# Patient Record
Sex: Male | Born: 1950 | Race: White | Hispanic: No | Marital: Married | Smoking: Never smoker
Health system: Southern US, Community
[De-identification: ages and names within clinical notes are randomized; demographics above are authoritative.]

## PROBLEM LIST (undated history)

## (undated) ENCOUNTER — Emergency Department (HOSPITAL_COMMUNITY): Payer: PPO

## (undated) DIAGNOSIS — I10 Essential (primary) hypertension: Secondary | ICD-10-CM

## (undated) DIAGNOSIS — I2510 Atherosclerotic heart disease of native coronary artery without angina pectoris: Secondary | ICD-10-CM

## (undated) DIAGNOSIS — K219 Gastro-esophageal reflux disease without esophagitis: Secondary | ICD-10-CM

## (undated) DIAGNOSIS — F329 Major depressive disorder, single episode, unspecified: Secondary | ICD-10-CM

## (undated) DIAGNOSIS — F32A Depression, unspecified: Secondary | ICD-10-CM

## (undated) DIAGNOSIS — E785 Hyperlipidemia, unspecified: Secondary | ICD-10-CM

## (undated) HISTORY — PX: CORONARY ARTERY BYPASS GRAFT: SHX141

## (undated) HISTORY — PX: HERNIA REPAIR: SHX51

## (undated) HISTORY — PX: CARDIAC SURGERY: SHX584

---
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
## 2017-05-25 DIAGNOSIS — E538 Deficiency of other specified B group vitamins: Secondary | ICD-10-CM | POA: Diagnosis not present

## 2017-05-25 DIAGNOSIS — E782 Mixed hyperlipidemia: Secondary | ICD-10-CM | POA: Diagnosis not present

## 2017-05-25 DIAGNOSIS — R5383 Other fatigue: Secondary | ICD-10-CM | POA: Diagnosis not present

## 2017-05-25 DIAGNOSIS — I10 Essential (primary) hypertension: Secondary | ICD-10-CM | POA: Diagnosis not present

## 2017-05-25 DIAGNOSIS — I739 Peripheral vascular disease, unspecified: Secondary | ICD-10-CM | POA: Diagnosis not present

## 2017-05-25 DIAGNOSIS — I252 Old myocardial infarction: Secondary | ICD-10-CM | POA: Diagnosis not present

## 2017-05-25 DIAGNOSIS — R5381 Other malaise: Secondary | ICD-10-CM | POA: Diagnosis not present

## 2017-05-25 DIAGNOSIS — I25810 Atherosclerosis of coronary artery bypass graft(s) without angina pectoris: Secondary | ICD-10-CM | POA: Diagnosis not present

## 2017-06-04 DIAGNOSIS — I8002 Phlebitis and thrombophlebitis of superficial vessels of left lower extremity: Secondary | ICD-10-CM | POA: Diagnosis not present

## 2017-06-12 DIAGNOSIS — I25810 Atherosclerosis of coronary artery bypass graft(s) without angina pectoris: Secondary | ICD-10-CM | POA: Diagnosis not present

## 2017-06-12 DIAGNOSIS — I10 Essential (primary) hypertension: Secondary | ICD-10-CM | POA: Diagnosis not present

## 2017-06-12 DIAGNOSIS — I252 Old myocardial infarction: Secondary | ICD-10-CM | POA: Diagnosis not present

## 2017-06-12 DIAGNOSIS — E782 Mixed hyperlipidemia: Secondary | ICD-10-CM | POA: Diagnosis not present

## 2017-07-17 DIAGNOSIS — I803 Phlebitis and thrombophlebitis of lower extremities, unspecified: Secondary | ICD-10-CM | POA: Diagnosis not present

## 2017-07-17 DIAGNOSIS — I8311 Varicose veins of right lower extremity with inflammation: Secondary | ICD-10-CM | POA: Diagnosis not present

## 2017-07-17 DIAGNOSIS — I8312 Varicose veins of left lower extremity with inflammation: Secondary | ICD-10-CM | POA: Diagnosis not present

## 2017-08-26 DIAGNOSIS — I25810 Atherosclerosis of coronary artery bypass graft(s) without angina pectoris: Secondary | ICD-10-CM | POA: Diagnosis not present

## 2017-08-26 DIAGNOSIS — I739 Peripheral vascular disease, unspecified: Secondary | ICD-10-CM | POA: Diagnosis not present

## 2017-08-26 DIAGNOSIS — E782 Mixed hyperlipidemia: Secondary | ICD-10-CM | POA: Diagnosis not present

## 2017-08-26 DIAGNOSIS — R5383 Other fatigue: Secondary | ICD-10-CM | POA: Diagnosis not present

## 2017-08-26 DIAGNOSIS — R5381 Other malaise: Secondary | ICD-10-CM | POA: Diagnosis not present

## 2017-08-26 DIAGNOSIS — E538 Deficiency of other specified B group vitamins: Secondary | ICD-10-CM | POA: Diagnosis not present

## 2017-08-26 DIAGNOSIS — M150 Primary generalized (osteo)arthritis: Secondary | ICD-10-CM | POA: Diagnosis not present

## 2017-08-26 DIAGNOSIS — I10 Essential (primary) hypertension: Secondary | ICD-10-CM | POA: Diagnosis not present

## 2017-08-31 DIAGNOSIS — L03113 Cellulitis of right upper limb: Secondary | ICD-10-CM | POA: Diagnosis not present

## 2017-08-31 DIAGNOSIS — S61011A Laceration without foreign body of right thumb without damage to nail, initial encounter: Secondary | ICD-10-CM | POA: Diagnosis not present

## 2017-08-31 DIAGNOSIS — R413 Other amnesia: Secondary | ICD-10-CM | POA: Diagnosis not present

## 2017-08-31 DIAGNOSIS — I739 Peripheral vascular disease, unspecified: Secondary | ICD-10-CM | POA: Diagnosis not present

## 2017-08-31 DIAGNOSIS — Z23 Encounter for immunization: Secondary | ICD-10-CM | POA: Diagnosis not present

## 2017-09-02 ENCOUNTER — Inpatient Hospital Stay (HOSPITAL_COMMUNITY)
Admission: EM | Admit: 2017-09-02 | Discharge: 2017-09-04 | DRG: 603 | Disposition: A | Discharge status: Home / Self Care | Payer: PPO | Attending: Internal Medicine | Admitting: Internal Medicine

## 2017-09-02 ENCOUNTER — Other Ambulatory Visit: Payer: Self-pay

## 2017-09-02 ENCOUNTER — Encounter (HOSPITAL_COMMUNITY): Payer: Self-pay | Admitting: Emergency Medicine

## 2017-09-02 ENCOUNTER — Emergency Department (HOSPITAL_COMMUNITY): Payer: PPO

## 2017-09-02 DIAGNOSIS — S60511A Abrasion of right hand, initial encounter: Secondary | ICD-10-CM | POA: Diagnosis present

## 2017-09-02 DIAGNOSIS — I2510 Atherosclerotic heart disease of native coronary artery without angina pectoris: Secondary | ICD-10-CM | POA: Diagnosis present

## 2017-09-02 DIAGNOSIS — I10 Essential (primary) hypertension: Secondary | ICD-10-CM | POA: Diagnosis not present

## 2017-09-02 DIAGNOSIS — W5501XA Bitten by cat, initial encounter: Secondary | ICD-10-CM | POA: Diagnosis not present

## 2017-09-02 DIAGNOSIS — L0390 Cellulitis, unspecified: Secondary | ICD-10-CM | POA: Diagnosis present

## 2017-09-02 DIAGNOSIS — L03113 Cellulitis of right upper limb: Secondary | ICD-10-CM | POA: Diagnosis not present

## 2017-09-02 DIAGNOSIS — Z7982 Long term (current) use of aspirin: Secondary | ICD-10-CM | POA: Diagnosis not present

## 2017-09-02 DIAGNOSIS — L089 Local infection of the skin and subcutaneous tissue, unspecified: Secondary | ICD-10-CM | POA: Diagnosis not present

## 2017-09-02 DIAGNOSIS — Z87891 Personal history of nicotine dependence: Secondary | ICD-10-CM

## 2017-09-02 DIAGNOSIS — Z79899 Other long term (current) drug therapy: Secondary | ICD-10-CM | POA: Diagnosis not present

## 2017-09-02 DIAGNOSIS — S61559A Open bite of unspecified wrist, initial encounter: Secondary | ICD-10-CM | POA: Diagnosis not present

## 2017-09-02 DIAGNOSIS — I739 Peripheral vascular disease, unspecified: Secondary | ICD-10-CM | POA: Diagnosis not present

## 2017-09-02 DIAGNOSIS — Z951 Presence of aortocoronary bypass graft: Secondary | ICD-10-CM | POA: Diagnosis not present

## 2017-09-02 DIAGNOSIS — W5503XA Scratched by cat, initial encounter: Secondary | ICD-10-CM | POA: Diagnosis not present

## 2017-09-02 DIAGNOSIS — R001 Bradycardia, unspecified: Secondary | ICD-10-CM | POA: Diagnosis not present

## 2017-09-02 DIAGNOSIS — S61451A Open bite of right hand, initial encounter: Secondary | ICD-10-CM | POA: Diagnosis not present

## 2017-09-02 HISTORY — DX: Essential (primary) hypertension: I10

## 2017-09-02 HISTORY — DX: Atherosclerotic heart disease of native coronary artery without angina pectoris: I25.10

## 2017-09-02 LAB — CBC WITH DIFFERENTIAL/PLATELET
ABS IMMATURE GRANULOCYTES: 0.2 10*3/uL — AB (ref 0.0–0.1)
BASOS PCT: 1 %
Basophils Absolute: 0.1 10*3/uL (ref 0.0–0.1)
Eosinophils Absolute: 0.2 10*3/uL (ref 0.0–0.7)
Eosinophils Relative: 2 %
HCT: 47 % (ref 39.0–52.0)
HEMOGLOBIN: 14.9 g/dL (ref 13.0–17.0)
IMMATURE GRANULOCYTES: 2 %
Lymphocytes Relative: 18 %
Lymphs Abs: 1.7 10*3/uL (ref 0.7–4.0)
MCH: 29.6 pg (ref 26.0–34.0)
MCHC: 31.7 g/dL (ref 30.0–36.0)
MCV: 93.4 fL (ref 78.0–100.0)
MONO ABS: 1.1 10*3/uL — AB (ref 0.1–1.0)
MONOS PCT: 12 %
NEUTROS ABS: 5.9 10*3/uL (ref 1.7–7.7)
NEUTROS PCT: 65 %
PLATELETS: 237 10*3/uL (ref 150–400)
RBC: 5.03 MIL/uL (ref 4.22–5.81)
RDW: 13 % (ref 11.5–15.5)
WBC: 9 10*3/uL (ref 4.0–10.5)

## 2017-09-02 LAB — BASIC METABOLIC PANEL
ANION GAP: 9 (ref 5–15)
BUN: 21 mg/dL (ref 8–23)
CO2: 26 mmol/L (ref 22–32)
Calcium: 9 mg/dL (ref 8.9–10.3)
Chloride: 104 mmol/L (ref 98–111)
Creatinine, Ser: 0.93 mg/dL (ref 0.61–1.24)
GFR calc Af Amer: >60 mL/min (ref >60)
GLUCOSE: 94 mg/dL (ref 70–99)
POTASSIUM: 4.5 mmol/L (ref 3.5–5.1)
Sodium: 139 mmol/L (ref 135–145)

## 2017-09-02 MED ORDER — AMLODIPINE BESYLATE 2.5 MG PO TABS
2.5000 mg | ORAL_TABLET | Freq: Every day | ORAL | Status: DC
  Administered 2017-09-03 – 2017-09-04 (×2): 2.5 mg via ORAL
  Filled 2017-09-02 (×2): qty 1

## 2017-09-02 MED ORDER — GABAPENTIN 300 MG PO CAPS
300.0000 mg | ORAL_CAPSULE | Freq: Every day | ORAL | Status: DC
Start: 2017-09-02 — End: 2017-09-04
  Administered 2017-09-02: 300 mg via ORAL
  Administered 2017-09-03: 600 mg via ORAL
  Filled 2017-09-02: qty 1
  Filled 2017-09-02: qty 2

## 2017-09-02 MED ORDER — SODIUM CHLORIDE 0.9 % IV SOLN
3.0000 g | Freq: Four times a day (QID) | INTRAVENOUS | Status: DC
  Administered 2017-09-02 – 2017-09-04 (×7): 3 g via INTRAVENOUS
  Filled 2017-09-02 (×9): qty 3

## 2017-09-02 MED ORDER — ACETAMINOPHEN 650 MG RE SUPP: 650.0000 mg | Freq: Four times a day (QID) | RECTAL | Status: DC | PRN

## 2017-09-02 MED ORDER — ONDANSETRON HCL 4 MG PO TABS: 4.0000 mg | ORAL_TABLET | Freq: Four times a day (QID) | ORAL | Status: DC | PRN

## 2017-09-02 MED ORDER — ASPIRIN 81 MG PO CHEW
81.0000 mg | CHEWABLE_TABLET | Freq: Every day | ORAL | Status: DC
  Administered 2017-09-04: 81 mg via ORAL
  Filled 2017-09-02: qty 1

## 2017-09-02 MED ORDER — ESCITALOPRAM OXALATE 20 MG PO TABS
20.0000 mg | ORAL_TABLET | Freq: Every day | ORAL | Status: DC
  Administered 2017-09-03 – 2017-09-04 (×2): 20 mg via ORAL
  Filled 2017-09-02 (×2): qty 1

## 2017-09-02 MED ORDER — LOSARTAN POTASSIUM 50 MG PO TABS
100.0000 mg | ORAL_TABLET | Freq: Every day | ORAL | Status: DC
  Administered 2017-09-03 – 2017-09-04 (×2): 100 mg via ORAL
  Filled 2017-09-02 (×2): qty 2

## 2017-09-02 MED ORDER — ACETAMINOPHEN 325 MG PO TABS
650.0000 mg | ORAL_TABLET | Freq: Four times a day (QID) | ORAL | Status: DC | PRN
  Administered 2017-09-03 – 2017-09-04 (×3): 650 mg via ORAL
  Filled 2017-09-02 (×3): qty 2

## 2017-09-02 MED ORDER — SENNOSIDES-DOCUSATE SODIUM 8.6-50 MG PO TABS: 1.0000 | ORAL_TABLET | Freq: Every evening | ORAL | Status: DC | PRN

## 2017-09-02 MED ORDER — FENTANYL CITRATE (PF) 100 MCG/2ML IJ SOLN
25.0000 ug | Freq: Once | INTRAMUSCULAR | Status: AC
  Administered 2017-09-02: 25 ug via INTRAVENOUS
  Filled 2017-09-02: qty 2

## 2017-09-02 MED ORDER — PANTOPRAZOLE SODIUM 40 MG PO TBEC
40.0000 mg | DELAYED_RELEASE_TABLET | Freq: Every day | ORAL | Status: DC
  Administered 2017-09-03 – 2017-09-04 (×2): 40 mg via ORAL
  Filled 2017-09-02 (×2): qty 1

## 2017-09-02 MED ORDER — ROSUVASTATIN CALCIUM 40 MG PO TABS
40.0000 mg | ORAL_TABLET | Freq: Every day | ORAL | Status: DC
  Administered 2017-09-02 – 2017-09-03 (×2): 40 mg via ORAL
  Filled 2017-09-02 (×4): qty 1

## 2017-09-02 MED ORDER — ENOXAPARIN SODIUM 40 MG/0.4ML ~~LOC~~ SOLN
40.0000 mg | SUBCUTANEOUS | Status: DC
  Administered 2017-09-02 – 2017-09-03 (×2): 40 mg via SUBCUTANEOUS
  Filled 2017-09-02 (×2): qty 0.4

## 2017-09-02 MED ORDER — SODIUM CHLORIDE 0.9 % IV SOLN
3.0000 g | Freq: Once | INTRAVENOUS | Status: AC
  Administered 2017-09-02: 3 g via INTRAVENOUS
  Filled 2017-09-02: qty 3

## 2017-09-02 MED ORDER — HYDROCODONE-ACETAMINOPHEN 5-325 MG PO TABS
1.0000 | ORAL_TABLET | ORAL | Status: DC | PRN
  Administered 2017-09-02 – 2017-09-03 (×3): 2 via ORAL
  Filled 2017-09-02 (×3): qty 2

## 2017-09-02 MED ORDER — METOPROLOL TARTRATE 25 MG PO TABS
25.0000 mg | ORAL_TABLET | Freq: Two times a day (BID) | ORAL | Status: DC
  Administered 2017-09-02 – 2017-09-04 (×4): 25 mg via ORAL
  Filled 2017-09-02 (×4): qty 1

## 2017-09-02 MED ORDER — ONDANSETRON HCL 4 MG/2ML IJ SOLN: 4.0000 mg | Freq: Four times a day (QID) | INTRAMUSCULAR | Status: DC | PRN

## 2017-09-02 NOTE — ED Notes (Signed)
Attempted to call report x 1  

## 2017-09-02 NOTE — Consult Note (Signed)
Reason for Consult:right hand and wrist cat bite Referring Physician: [name] is an 67 y.o. male.  HPI: patient's very pleasant 67 year old right-hand-dominant male status post right hand and wrist cat bite or scratch that occurred on Sunday this past week has been treated with an initial dose of IV Rocephin and then started on by mouth Augmentin yesterday. Patient presents today with worsening swelling and pain in his right hand and wrist. He denies any fever, chills, or drainage.  Past Medical History:  Diagnosis Date  . Coronary artery disease   . Hypertension     Past Surgical History:  Procedure Laterality Date  . CARDIAC SURGERY    . HERNIA REPAIR      History reviewed. No pertinent family history.  Social History:  reports that he has never smoked. He has never used smokeless tobacco. He reports that he drank alcohol. He reports that he does not use drugs.  Allergies: No Known Allergies  Medications: Prior to Admission:  (Not in a hospital admission)  Results for orders placed or performed during the hospital encounter of 09/02/17 (from the past 48 hour(s))  CBC with Differential     Status: Abnormal   Collection Time: 09/02/17  1:07 PM  Result Value Ref Range   WBC 9.0 4.0 - 10.5 K/uL   RBC 5.03 4.22 - 5.81 MIL/uL   Hemoglobin 14.9 13.0 - 17.0 g/dL   HCT 47.0 39.0 - 52.0 %   MCV 93.4 78.0 - 100.0 fL   MCH 29.6 26.0 - 34.0 pg   MCHC 31.7 30.0 - 36.0 g/dL   RDW 13.0 11.5 - 15.5 %   Platelets 237 150 - 400 K/uL   Neutrophils Relative % 65 %   Neutro Abs 5.9 1.7 - 7.7 K/uL   Lymphocytes Relative 18 %   Lymphs Abs 1.7 0.7 - 4.0 K/uL   Monocytes Relative 12 %   Monocytes Absolute 1.1 (H) 0.1 - 1.0 K/uL   Eosinophils Relative 2 %   Eosinophils Absolute 0.2 0.0 - 0.7 K/uL   Basophils Relative 1 %   Basophils Absolute 0.1 0.0 - 0.1 K/uL   Immature Granulocytes 2 %   Abs Immature Granulocytes 0.2 (H) 0.0 - 0.1 K/uL    Comment: Performed at [facility], [address], [place], [place] [address]    No results found.  Review of Systems  All other systems reviewed and are negative.  Blood pressure 127/71, pulse (!) 56, temperature 98.7 F (37.1 C), temperature source Oral, resp. rate 11, SpO2 96 %. Physical Exam  Constitutional: He is oriented to person, place, and time. He appears well-developed and well-nourished.  HENT:  Head: Normocephalic and atraumatic.  Neck: Normal range of motion.  Cardiovascular: Normal rate.  Respiratory: Effort normal.  Musculoskeletal:       Right wrist: He exhibits tenderness, swelling and effusion.  Right hand global dorsal swelling without discernible abscess and cellulitis. No evidence of joint or flexor sheath involvement.  Neurological: He is alert and oriented to person, place, and time.  Skin: Skin is warm. There is erythema.  Psychiatric: He has a normal mood and affect. His behavior is normal. Judgment and thought content normal.    Assessment/Plan: 67 year old male with history of cat bite or scratch to right hand and wrist approximately 3 days ago with what appears to be cellulitis involving the dorsal aspect of his right hand and wrist with no discernible abscess noted. At this point time  I recommend admission for intravenous Unasyn and elevation of extremity with reassessment in 24-48 hours. Have discussed with the patient who agrees with plan.  Carlos Rowland [facility] 09/02/2017, 1:41 PM

## 2017-09-02 NOTE — Progress Notes (Signed)
Spoke to covering MD to order sling arm elevator to keep Pt's arm elevated to help with swelling and pain management, approved.  Phoned ortho tech to have them come and place the Pt in a sling.

## 2017-09-02 NOTE — H&P (Signed)
Date: 09/02/2017               Patient Name:  Carlos Rowland MRN: [id number]  DOB: 07-Jun-1950 Age / Sex: 67 y.o., male   PCP: Raina Mina., MD         Medical Service: Internal Medicine Teaching Service         Attending Physician: Dr. Aldine Contes, MD    First Contact: Dr. Frederico Hamman  Pager: [phone]  Second Contact: Dr. Danford Bad  Pager: [phone]       After Hours (After 5p/  First Contact Pager: [phone]  weekends / holidays): Second Contact Pager: [phone]   Chief Complaint: Pain and swelling of R hand   History of Present Illness:  Carlos Rowland is a 67 yo M with history of CAD s/p CABG, PVD, and HTN who presents to the ED with worsening erythema, pain, and swelling of R hand and arm after a cat bite/scrath 3 days ago. He was seen by his PCP 2 days ago and received Rocephin x1 and started on Augmentin. He has been compliant with the antibiotics but his pain and swelling have continued to worsen. He has not been febrile and denies systemic symptoms of infection. Denies HA, changes in vision, CP, SOB, cough, abdominal pain, N/V, urinary symptoms, and changes in bowel movements.   ED course: Patient afebrile and HDS on arrival. Blood work unremarkable. R arm and hand imagine unremarkable as well. He was evaluated by ortho who recommended IV antibiotics and re-evaluation tomorrow. He received Fentanyl x1.   Meds:  Current Meds  Medication Sig  . amLODipine (NORVASC) 2.5 MG tablet Take 2.5 mg by mouth daily.  [name] Kitchen amoxicillin-clavulanate (AUGMENTIN) 875-125 MG tablet Take 1 tablet by mouth 2 (two) times daily. Duration 10 days  . aspirin 81 MG chewable tablet Chew 81 mg by mouth daily.  [name] Kitchen escitalopram (LEXAPRO) 20 MG tablet Take 20 mg by mouth daily.  [name] Kitchen gabapentin (NEURONTIN) 300 MG capsule Take 300-600 mg by mouth at bedtime.  [name] Kitchen losartan (COZAAR) 100 MG tablet Take 100 mg by mouth daily.  . metoprolol tartrate (LOPRESSOR) 25 MG tablet Take 25 mg by mouth 2 (two) times daily.  .  pantoprazole (PROTONIX) 40 MG tablet Take 40 mg by mouth daily.  . promethazine (PHENERGAN) 25 MG tablet Take 25 mg by mouth every 6 (six) hours as needed for nausea.  . rosuvastatin (CRESTOR) 40 MG tablet Take 40 mg by mouth daily.  . traMADol (ULTRAM) 50 MG tablet Take 50 mg by mouth 2 (two) times daily as needed.    Allergies: Allergies as of 09/02/2017  . (No Known Allergies)   Past Medical History:  Diagnosis Date  . Coronary artery disease   . Hypertension     Family History:  History reviewed. No pertinent family history.  Social History: Quit smoking 3 years ago. No alcohol or drug use.   Review of Systems: A complete ROS was negative except as per HPI.   Physical Exam: Blood pressure (!) 147/88, pulse (!) 53, temperature 98.2 F (36.8 C), temperature source Oral, resp. rate 18, height 6' (1.829 m), weight 215 lb (97.5 kg), SpO2 97 %.  Physical Exam  Constitutional: He is oriented to person, place, and time and well-developed, well-nourished, and in no distress.  HENT:  Head: Normocephalic and atraumatic.  Mouth/Throat: Oropharynx is clear and moist.  Eyes: Conjunctivae are normal. No scleral icterus.  Neck: Normal range of motion. Neck supple.  Cardiovascular:  Normal rate, regular rhythm and normal heart sounds. Exam reveals no gallop and no friction rub.  No murmur heard. Pulmonary/Chest: Effort normal and breath sounds normal. No respiratory distress. He has no wheezes. He has no rales.  Abdominal: Soft. Bowel sounds are normal. He exhibits no distension. There is no tenderness.  Musculoskeletal: He exhibits no edema (R arm and hand edema ).  Lymphadenopathy:    He has no cervical adenopathy.  Neurological: He is alert and oriented to person, place, and time.  Skin:  R hand erythematous and warm to the touch. Serous discharge from wound at base of thumb. No purulence, induration or fluctuance noted    EKG: none obtained   CXR: none obtained   Assessment &  Plan by Problem: Active Problems:   Cellulitis  # R hand/arm cellulitis: Patient presenting with worsening R hand pain, swelling and erythema while on PO antibiotic therapy after a cat bite/scratch. No systemic symptoms of infection at this time. No lymphadenopathy on exam. Evaluated by ortho who recommended IV antibiotics and re-evaluation in AM.  - Ortho following, appreciate assistance - NPO at MN for possible surgical intervention tomorrow  - Unasyn  - Norco q4h PRN for pain   # CAD s/p CABG:  - Continue home amlodipine, losartan, metoprolol and rosuvastatin   # HTN:  - Continue home meds as above   F: none  E: monitor  N: HH --> NPO   VTE ppx: SQ lovenox   Code status: Full code, not confirmed on admission  Dispo: Admit patient to Inpatient with expected length of stay greater than 2 midnights.  SignedWelford Roche, MD 09/02/2017, 7:47 PM  Pager: [phone]

## 2017-09-02 NOTE — Progress Notes (Signed)
Orthopedic Tech Progress Note Patient Details:  Carlos Rowland [birth year] [id number]  Ortho Devices Type of Ortho Device: Sling arm elevator Ortho Device/Splint Location: rue Ortho Device/Splint Interventions: Application   Post Interventions Patient Tolerated: Well Instructions Provided: Care of device   Hildred Priest 09/02/2017, 6:25 PM

## 2017-09-02 NOTE — ED Triage Notes (Signed)
Bitten  By cat on Sunday saw a dr and given meds but left hand has cont to swell and and become  More red and painful, hand very painful

## 2017-09-02 NOTE — ED Provider Notes (Signed)
[facility] EMERGENCY DEPARTMENT Provider Note   CSN: [id number] Arrival date & time: 09/02/17  1023   History   Chief Complaint Chief Complaint  Patient presents with  . Hand Pain    HPI Carlos Rowland is a 67 y.o. male with a hx of CAD and HTN who presents to the ED with worsening R hand pain/redness for the past 4 days. Patient states that a house cat that has been rabies vaccinated either bit or scratched him multiple times this past Sunday (4 days ago). He was seen by his PCP the subsequent day, given a tetanus shot, 1 g of Rocephin, and started on either Amoxicillin or Augmentin, patient is unsure. He has been taking the abx as prescribed however the pain, swelling, and redness has progressively worsened and is spreading more to the wrist area. Rates his pain a 10/10 in severity, worse with movement, minimally alleviated by tramadol and tylenol at home. Patient has had some nausea without vomiting. Denies fever, chills, abdominal pain, vomiting, chest pain, or dyspnea. Patient is R hand dominant. Last PO intake was 0900 this AM- coffee with creamer, dry cheerios, spoon of honey.   HPI  Past Medical History:  Diagnosis Date  . Coronary artery disease   . Hypertension     There are no active problems to display for this patient.   Past Surgical History:  Procedure Laterality Date  . CARDIAC SURGERY    . HERNIA REPAIR          Home Medications    Prior to Admission medications   Not on File    Family History No family history on file.  Social History Social History   Tobacco Use  . Smoking status: Never Smoker  . Smokeless tobacco: Never Used  Substance Use Topics  . Alcohol use: Not Currently  . Drug use: Never     Allergies   Patient has no known allergies.   Review of Systems Review of Systems  Constitutional: Negative for chills and fever.  Respiratory: Negative for shortness of breath.   Cardiovascular: Negative for chest  pain.  Gastrointestinal: Positive for nausea. Negative for abdominal pain, constipation, diarrhea and vomiting.  Musculoskeletal:       Positive for pain, swelling, and redness to the right hand and wrist.  Neurological: Negative for weakness and numbness.   Physical Exam Updated Vital Signs BP 113/75   Pulse 60   Temp 98.7 F (37.1 C) (Oral)   Resp 14   SpO2 100%   Physical Exam  Constitutional: He appears well-developed and well-nourished. No distress.  HENT:  Head: Normocephalic and atraumatic.  Eyes: Conjunctivae are normal. Right eye exhibits no discharge. Left eye exhibits no discharge.  Cardiovascular: Regular rhythm. Bradycardia present.  Pulses:      Radial pulses are 2+ on the right side, and 2+ on the left side.  Pulmonary/Chest: Effort normal and breath sounds normal. No respiratory distress.  Abdominal: Soft. He exhibits no distension. There is no tenderness.  Musculoskeletal:  RUE: Patient with erythema, warmth,  to the dorsum of the right hand extending to the PIP joint of the digits as well as to the mid forearm. Dorsum of the hand with soft tissue swelling.  Patient has multiple small wounds to the right upper extremity consistent with reported cat bite/scratch.  Patient has normal range of motion of his elbow.  He has limited range of motion of the wrist, he is able to minimally flex and extend.  He is able to make a full fist but this is painful to do so.  He is able to fully extend the digits.  Neurological: He is alert.  Clear speech.  Sensation grossly intact bilateral upper extremities.  Patient grip strength difficult to assess secondary to pain with making full fist.  Skin: Capillary refill takes less than 2 seconds.  Psychiatric: He has a normal mood and affect. His behavior is normal. Thought content normal.  Nursing note and vitals reviewed.           ED Treatments / Results  Labs Results for orders placed or performed during the hospital  encounter of 09/02/17  CBC with Differential  Result Value Ref Range   WBC 9.0 4.0 - 10.5 K/uL   RBC 5.03 4.22 - 5.81 MIL/uL   Hemoglobin 14.9 13.0 - 17.0 g/dL   HCT 47.0 39.0 - 52.0 %   MCV 93.4 78.0 - 100.0 fL   MCH 29.6 26.0 - 34.0 pg   MCHC 31.7 30.0 - 36.0 g/dL   RDW 13.0 11.5 - 15.5 %   Platelets 237 150 - 400 K/uL   Neutrophils Relative % 65 %   Neutro Abs 5.9 1.7 - 7.7 K/uL   Lymphocytes Relative 18 %   Lymphs Abs 1.7 0.7 - 4.0 K/uL   Monocytes Relative 12 %   Monocytes Absolute 1.1 (H) 0.1 - 1.0 K/uL   Eosinophils Relative 2 %   Eosinophils Absolute 0.2 0.0 - 0.7 K/uL   Basophils Relative 1 %   Basophils Absolute 0.1 0.0 - 0.1 K/uL   Immature Granulocytes 2 %   Abs Immature Granulocytes 0.2 (H) 0.0 - 0.1 K/uL  Basic metabolic panel  Result Value Ref Range   Sodium 139 135 - 145 mmol/L   Potassium 4.5 3.5 - 5.1 mmol/L   Chloride 104 98 - 111 mmol/L   CO2 26 22 - 32 mmol/L   Glucose, Bld 94 70 - 99 mg/dL   BUN 21 8 - 23 mg/dL   Creatinine, Ser 0.93 0.61 - 1.24 mg/dL   Calcium 9.0 8.9 - 10.3 mg/dL   GFR calc non Af Amer >60 >60 mL/min   GFR calc Af Amer >60 >60 mL/min   Anion gap 9 5 - 15    EKG None  Radiology Dg Forearm Right  Result Date: 09/02/2017 CLINICAL DATA:  Cat bite EXAM: RIGHT FOREARM - 2 VIEW COMPARISON:  None. FINDINGS: There is no evidence of fracture or other focal bone lesions. Soft tissues are unremarkable. IMPRESSION: No foreign body or osseous abnormality. Electronically Signed   By: Ulyses Jarred M.D.   On: 09/02/2017 13:52   Dg Hand Complete Right  Result Date: 09/02/2017 CLINICAL DATA:  Cat bite EXAM: RIGHT HAND - COMPLETE 3+ VIEW COMPARISON:  None. FINDINGS: There is no evidence of fracture or dislocation. There is no evidence of arthropathy or other focal bone abnormality. There is soft tissue swelling. No retained foreign body. No soft tissue gas. IMPRESSION: Dorsal hand soft tissue swelling without foreign body or osseous abnormality.  Electronically Signed   By: Ulyses Jarred M.D.   On: 09/02/2017 14:01    Procedures Procedures (including critical care time)  Medications Ordered in ED Medications - No data to display   Initial Impression / Assessment and Plan / ED Course  I have reviewed the triage vital signs and the nursing notes.  Pertinent labs & imaging results that were available during my care of the patient were reviewed  by me and considered in my medical decision making (see chart for details).  Patient presents with worsening R hand pain/swelling/erythema s/p cat bit vs scratch 4 days ago despite outpatient abx.  Patient nontoxic appearing, resting comfortably. Exam/images as above, appears to be infectious- cellulitic vs. Abscess formation, no discrete palpable fluctuance on my exam. Will obtain basic labs and imaging. Hand surgery consult placed.    13:05: CONSULT: Discussed case with Jeani Hawking from hand center- she will place page out to hand surgeon Dr. Burney Gauze.   13:14: CONSULT: Discussed with Jeani Hawking who spoke with Dr. Burney Gauze- requesting call back when we have labs and xray results. Instructed no abx start until results and his recommendations.    13:20: Dr. Burney Gauze in the ER, pending patient return from X-ray for his evaluation.   13:40: Dr. Burney Gauze has evaluated the patient- requesting patient be admitted to hospitalist service- requesting Unasyn 3 g q6h, feels he will require 36-48 hours of IV abx. Patient may have breakfast tomorrow but then subsequently NPO incase requiring surgical intervention.   Labs reviewed, unremarkable, WBC 9.0. X-rays without acute osseous abnormalities or foreign bodies.   14:01: CONSULT: Discussed case with internal medicine teaching service, will come to ER to evaluate patient for admission.   Final Clinical Impressions(s) / ED Diagnoses   Final diagnoses:  Cat scratch of right hand with infection, initial encounter    ED Discharge Orders    None         Amaryllis Dyke, PA-C 09/02/17 1412    Sherwood Gambler, MD 09/03/17 1705

## 2017-09-02 NOTE — ED Notes (Signed)
Patient transported to X-ray 

## 2017-09-03 DIAGNOSIS — I739 Peripheral vascular disease, unspecified: Secondary | ICD-10-CM

## 2017-09-03 DIAGNOSIS — S60511A Abrasion of right hand, initial encounter: Secondary | ICD-10-CM

## 2017-09-03 DIAGNOSIS — Z951 Presence of aortocoronary bypass graft: Secondary | ICD-10-CM

## 2017-09-03 DIAGNOSIS — Z79899 Other long term (current) drug therapy: Secondary | ICD-10-CM

## 2017-09-03 DIAGNOSIS — I10 Essential (primary) hypertension: Secondary | ICD-10-CM

## 2017-09-03 DIAGNOSIS — L03113 Cellulitis of right upper limb: Principal | ICD-10-CM

## 2017-09-03 DIAGNOSIS — W5503XA Scratched by cat, initial encounter: Secondary | ICD-10-CM

## 2017-09-03 DIAGNOSIS — I2510 Atherosclerotic heart disease of native coronary artery without angina pectoris: Secondary | ICD-10-CM

## 2017-09-03 LAB — HIV ANTIBODY (ROUTINE TESTING W REFLEX): HIV Screen 4th Generation wRfx: NONREACTIVE

## 2017-09-03 NOTE — Progress Notes (Signed)
   Subjective:  No acute events overnight. Patient continues to report pain in R hand and arm. Pain medication helping. Otherwise no complaints this morning.   Objective:  Vital signs in last 24 hours: Vitals:   09/02/17 1547 09/02/17 2113 09/03/17 0540 09/03/17 1005  BP: (!) 147/88 (!) 142/75 (!) 145/81 133/73  Pulse: (!) 53 63 (!) 58 (!) 59  Resp: 18 20 20    Temp: 98.2 F (36.8 C) 99.1 F (37.3 C) 98.6 F (37 C)   TempSrc: Oral Oral Oral   SpO2: 97% 93% 95%   Weight:      Height:       Physical Exam  Constitutional: He is oriented to person, place, and time and well-developed, well-nourished, and in no distress.  Cardiovascular: Normal rate and regular rhythm. Exam reveals no gallop and no friction rub.  No murmur heard. Pulmonary/Chest: Effort normal and breath sounds normal. No respiratory distress. He has no wheezes. He has no rales.  Abdominal: Soft. Bowel sounds are normal. He exhibits no distension. There is no tenderness.  Musculoskeletal: He exhibits edema (R hand swelling + dependent edema on R elbow ) and tenderness (R hand and wrist TTP ).  Neurological: He is alert and oriented to person, place, and time.  Skin:  R hand remains warm and erythematous. Serous fluids from puncture wounds.     Assessment/Plan:  Active Problems:   Cellulitis  # R hand/arm cellulitis: Minimal improvement from yesterday. R hand appears less swollen but patient has kept arm elevated and now has significant dependent edema on R elbow. Continues to have warmth and erythema as well. Will continue Unasyn and order MRI hand for further evaluation. No systemic symptoms of infection at this time. Per ortho, no surgical intervention at this time.  - Unasyn q6h  - Norco q4h PRN for pain   # CAD s/p CABG: Continue home amlodipine, losartan, metoprolol and rosuvastatin   # HTN: Continue home meds as above   Dispo: Anticipated discharge in approximately 1-2 day(s).   Carlos Roche, MD 09/03/2017, 1:24 PM Pager: [phone]

## 2017-09-03 NOTE — Progress Notes (Signed)
Patient seen and examined at bedside this afternoon. Patient's right hand cellulitis and swelling is completely resolved. There is no  Need for surgical intervention at this point time. Recommend  Continuation of intravenous Unasyn with discharge tomorrow on by mouth Augmentin. No need for follow-up at this point time.

## 2017-09-03 NOTE — Progress Notes (Signed)
  Date: 09/03/2017  Patient name: EDD REPPERT  Medical record number: [id number]  Date of birth: 1950-09-02   I have seen and evaluated Loretha Stapler and discussed their care with the Residency Team.  In brief, patient is 67 year old male with past mental history of CAD status post CABG, PVD and hypertension who presents to the ED with worsening pain and swelling in his right hand after cat bite/scratch 3 days prior to admission.  Patient states that 3 days prior to his admission he was bit/scratched by cat and developed pain and swelling in his right hand.  He went to see his PCP 2 days prior to admission and received 1 dose of ceftriaxone in his PCPs office and was sent home on Augmentin.  Patient states that he was compliant with the Augmentin but despite taking the antibiotics he noted progressive pain and swelling in his right hand as well as redness which was spreading.  He denies any fevers or chills, no chest pain, shortness of breath, no palpitations, lightheadedness, syncope but no nausea or vomiting, no abdominal pain, no diarrhea.  Today patient states his swelling is improved slightly but he still has persistent pain over his right wrist.  PMHx, Fam Hx, and/or Soc Hx : As per resident admit note  Vitals:   09/03/17 0540 09/03/17 1005  BP: (!) 145/81 133/73  Pulse: (!) 58 (!) 59  Resp: 20   Temp: 98.6 F (37 C)   SpO2: 95%    General: Awake, alert, oriented x3, NAD CVS: Regular rate and rhythm, normal heart sounds Lungs: CTA bilaterally Abdomen: Soft, nontender, nondistended, normoactive bowel sounds Extremities: Right hand swelling and erythema noted with tenderness to palpation over right wrist.  Patient also noted to have dependent swelling in his right elbow as his right arm has been elevated overnight.  Assessment and Plan: I have seen and evaluated the patient as outlined above. I agree with the formulated Assessment and Plan as detailed in the residents' note, with  the following changes:   1.  Right hand cellulitis: -Patient presented to the ED with worsening pain, swelling and redness in his right hand after cat bite/scratch 3 days prior to his admission and failed outpatient therapy with Augmentin. -Orthopedics follow-up and recommendations appreciated -We will continue with IV Unasyn for now -Patient still has persistent pain and erythema over his right hand.  Orthopedics to follow-up today to decide if patient will require surgery or not. -Continue pain control for now -No further work-up at this time  Aldine Contes, MD 6/27/201911:19 AM

## 2017-09-04 LAB — CBC
HCT: 45.9 % (ref 39.0–52.0)
Hemoglobin: 14.8 g/dL (ref 13.0–17.0)
MCH: 29.7 pg (ref 26.0–34.0)
MCHC: 32.2 g/dL (ref 30.0–36.0)
MCV: 92.2 fL (ref 78.0–100.0)
PLATELETS: 233 10*3/uL (ref 150–400)
RBC: 4.98 MIL/uL (ref 4.22–5.81)
RDW: 12.6 % (ref 11.5–15.5)
WBC: 6.9 10*3/uL (ref 4.0–10.5)

## 2017-09-04 MED ORDER — AMOXICILLIN-POT CLAVULANATE 875-125 MG PO TABS: 1.0000 | ORAL_TABLET | Freq: Two times a day (BID) | ORAL | 0 refills | Status: AC

## 2017-09-04 NOTE — Progress Notes (Signed)
Yesterdays exam showed vast improvement from initial presentation  Dependent edema to be expected  Would repeat WBC before MRI  Clinical picture more consistent with cellulitis than deep abscess

## 2017-09-04 NOTE — Progress Notes (Signed)
Internal Medicine Attending:   I saw and examined the patient. I reviewed the resident's note and I agree with the resident's findings and plan as documented in the resident's note.  Patient feels well today with no new complaints.  He states that the pain and swelling in his right hand continues to improve.  Patient was initially admitted with right hand cellulitis secondary to cat scratch/bite.  Orthopedic follow-up and recommendations appreciated.  Patient stable for discharge home today.  He will need to complete a 10-day course of antibiotics.  We will transition him from IV Unasyn to oral Augmentin to complete his course of antibiotics.  Continue with pain control.  Patient will need close follow-up to ensure resolution of his cellulitis.

## 2017-09-04 NOTE — Discharge Summary (Signed)
Name: Carlos Rowland MRN: [id number] DOB: Jul 13, 1950 67 y.o. PCP: Raina Mina., MD  Date of Admission: 09/02/2017 10:24 AM Date of Discharge: 09/04/2017 Attending Physician: Aldine Contes, MD  Discharge Diagnosis: 1. Right hand and forearm cellulitis  2. Essential hypertension  3. CAD s/p CABG   Discharge Medications: Allergies as of 09/04/2017   No Known Allergies     Medication List    TAKE these medications   amLODipine 2.5 MG tablet Commonly known as:  NORVASC Take 2.5 mg by mouth daily.   amoxicillin-clavulanate 875-125 MG tablet Commonly known as:  AUGMENTIN Take 1 tablet by mouth 2 (two) times daily for 14 days. What changed:  additional instructions   aspirin 81 MG chewable tablet Chew 81 mg by mouth daily.   escitalopram 20 MG tablet Commonly known as:  LEXAPRO Take 20 mg by mouth daily.   gabapentin 300 MG capsule Commonly known as:  NEURONTIN Take 300-600 mg by mouth at bedtime.   losartan 100 MG tablet Commonly known as:  COZAAR Take 100 mg by mouth daily.   metoprolol tartrate 25 MG tablet Commonly known as:  LOPRESSOR Take 25 mg by mouth 2 (two) times daily.   pantoprazole 40 MG tablet Commonly known as:  PROTONIX Take 40 mg by mouth daily.   promethazine 25 MG tablet Commonly known as:  PHENERGAN Take 25 mg by mouth every 6 (six) hours as needed for nausea.   rosuvastatin 40 MG tablet Commonly known as:  CRESTOR Take 40 mg by mouth daily.   traMADol 50 MG tablet Commonly known as:  ULTRAM Take 50 mg by mouth 2 (two) times daily as needed.       Disposition and follow-up:   Mr.Satish E Carnevale was discharged from [facility] in Stable condition.  At the hospital follow up visit please address:  1.  Please assess for ongoing signs and symptoms of infection in R hand and forearm. Please ensure compliance with antibiotic therapy, Augmentin BID x 8 days.   2.  Labs / imaging needed at time of follow-up: None    3.  Pending labs/ test needing follow-up: None   Follow-up Appointments: Follow-up Information    Raina Mina., MD. Schedule an appointment as soon as possible for a visit.   Specialty:  Internal Medicine Why:  PLease schedule a hospital follow up appointment with your regular doctor within the next  7 days.  Contact information: [address] [place] [address] [phone]           Hospital Course by problem list:  1. Right hand and forearm cellulitis secondary to cat scratch/bite: Patient presented to the ED with worsening swelling, pain, and erythema of R hand and arm after failing Augmentin as an outpatient. He was afebrile, hemodynamically stable, and without leukocytosis. He received Unasyn for 48 hours during this admission with marked improvement in symptoms. Hand surgery evaluated patient, who recommended no surgical intervention and Augmentin as an outpatient. Recommend outpatient follow up with hand surgery if patient fails to improve with Augmentin.   2. Essential hypertension: Patient was continued on his home medications. No changes were made.   3. CAD s/p CABG: Patient was continued on his home medications. No changes were made.    Discharge Vitals:   BP 131/79 (BP Location: Left Arm)   Pulse (!) 55   Temp 98.3 F (36.8 C) (Oral)   Resp 16   Ht 6' (1.829 m)   [facility]  215 lb (97.5 kg)   SpO2 98%   BMI 29.16 kg/m   Pertinent Labs, Studies, and Procedures:  CBC Latest Ref Rng & Units 09/04/2017 09/02/2017  WBC 4.0 - 10.5 K/uL 6.9 9.0  Hemoglobin 13.0 - 17.0 g/dL 14.8 14.9  Hematocrit 39.0 - 52.0 % 45.9 47.0  Platelets 150 - 400 K/uL 233 237   BMP Latest Ref Rng & Units 09/02/2017  Glucose 70 - 99 mg/dL 94  BUN 8 - 23 mg/dL 21  Creatinine 0.61 - 1.24 mg/dL 0.93  Sodium 135 - 145 mmol/L 139  Potassium 3.5 - 5.1 mmol/L 4.5  Chloride 98 - 111 mmol/L 104  CO2 22 - 32 mmol/L 26  Calcium 8.9 - 10.3 mg/dL 9.0   XR R hand 6/26: FINDINGS: There is no  evidence of fracture or dislocation. There is no evidence of arthropathy or other focal bone abnormality. There is soft tissue swelling. No retained foreign body. No soft tissue gas. IMPRESSION: Dorsal hand soft tissue swelling without foreign body or osseous abnormality.  XR R forearm 6/26: FINDINGS: There is no evidence of fracture or other focal bone lesions. Soft tissues are unremarkable. IMPRESSION: No foreign body or osseous abnormality.   Discharge Instructions: Discharge Instructions    Call MD for:  redness, tenderness, or signs of infection (pain, swelling, redness, odor or green/yellow discharge around incision site)   Complete by:  As directed    Call MD for:  severe uncontrolled pain   Complete by:  As directed    Call MD for:  temperature >100.4   Complete by:  As directed    Diet - low sodium heart healthy   Complete by:  As directed    Discharge instructions   Complete by:  As directed    Mr. Maaz, Spiering were admitted to the hospital due to cellulitis in your right hand and cellulitis. You were treated with antibiotics through an IV while you were in the hospital. The hand surgeons evaluated you while you were here and did not think you needed surgery. You will need to continue taking antibiotics for the next 8 days. We prescribed you Augmentin 1 tablet two times a day for the next 8 days. You can start taking this today. We recommend that you complete the full course of 8 days even if you start feeling better before then. Please make an appointment with your regular doctor within then next week to make sure your hand continues to improve on this antibiotic. Please call us if you have any questions.   - Dr. Frederico Hamman   Increase activity slowly   Complete by:  As directed       Signed: Welford Roche, MD 09/06/2017, 11:25 AM   Pager:  [phone]

## 2017-09-04 NOTE — Care Management Important Message (Signed)
Important Message  Patient Details  Name: Carlos Rowland MRN: [id number] Date of Birth: [birth year]   Medicare Important Message Given:  Yes    Orbie Pyo 09/04/2017, 2:52 PM

## 2017-09-04 NOTE — Consult Note (Signed)
[facility] CM Primary Care Navigator  09/04/2017  Carlos Rowland 19-Jul-1950 [id number]   Met withpatient at the bedside toidentify possible discharge needs. Patientreportshaving "pain and swelling to right hand from cat scratch" whichhad ledto thisadmission. (right hand/ arm cellulitis)  Patientendorses Dr.Greg Grisso/ Koleen Nimrod, PA with [facility] ashisprimary care provider.   [facility] [place].  Patientreports thathehas beenmanaginghisownmedications straight out of the containers prior to admission, but wife will be assisting him with it at home.  Patient verbalizedthat he was driving prior to this hospitalization butwife(Carlos Rowland) willbe providing transportation to hisdoctors' appointmentsafter discharge.  Patientstates that hiswifewill betheprimary caregiver at home.  Anticipated plan for dischargeis home per patient.  Patientvoiced understandingto callprimarycare provider'soffice whenhereturnshome,for a post discharge follow-upvisitwithin1- 2 weeksor sooner if needs arise.Patient letter (with PCP's contact number) was provided asareminder.   Discussed with patient regarding [facility] CM services available for health managementandresourcesat homebut he deniesany needs or issues for now.Patient verbalizedunderstandingof needto seekreferral from primary care provider to [facility] care management ifdeemed necessary and appropriatefor anyservicesin thefuture.  [facility] care management information was provided for futureneedsthat hemay have.  Patienthowever,verbally agreedand optedforEMMIcalls tofollow-up withhisrecoveryat home.   Referral made for [facility] General calls after discharge.    For additional questions please contact:  Edwena Felty A. Sylar Voong, BSN, RN-BC [facility] PRIMARY CARE Navigator Cell: [phone]

## 2017-09-04 NOTE — Progress Notes (Signed)
Pt is discharged to go home.  Discharge instructions and prescription antibiotics changes explained.

## 2017-09-04 NOTE — Progress Notes (Signed)
   Subjective:  No acute events overnight. Patient doing well this morning. Reports improvement in pain and swelling. Eating breakfast when seen. Denies fever and chills.   Objective:  Vital signs in last 24 hours: Vitals:   09/03/17 1005 09/03/17 1352 09/03/17 2102 09/04/17 0521  BP: 133/73 (!) 140/98 119/69 (!) 139/92  Pulse: (!) 59 (!) 54 60 (!) 56  Resp:   17 17  Temp:  98.5 F (36.9 C) 99.1 F (37.3 C) 98.4 F (36.9 C)  TempSrc:  Oral Oral Oral  SpO2:  99% 96% 96%  Weight:      Height:       Physical Exam  Constitutional: He is oriented to person, place, and time and well-developed, well-nourished, and in no distress.  Cardiovascular: Normal rate, regular rhythm and normal heart sounds. Exam reveals no gallop and no friction rub.  No murmur heard. Pulmonary/Chest: Effort normal and breath sounds normal. No respiratory distress. He has no wheezes. He has no rales.  Musculoskeletal: He exhibits edema (Mild R hand edema ).  Neurological: He is alert and oriented to person, place, and time.  Skin:  R hand erythema and swelling improving     Assessment/Plan:  Active Problems:   Cellulitis   # R hand/arm cellulitis: Pain, swelling, and erythema improving after 48 hours of IV antibiotics. CBC today with no leukocytosis. Patient has remained afebrile during this admission. Will transition to Augmentin. Will prescribe for 8 days to complete a 10 day course. Medically stable for discharge.  - Unasyn q6h, will transition to Augmentin per ortho recommendations.   - Norco q4h PRN for pain   # CAD s/p CABG: Continue home amlodipine, losartan, metoprolol and rosuvastatin   # HTN: Continue home meds as above    Dispo: Anticipated discharge today.   Welford Roche, MD 09/04/2017, 10:04 AM Pager: [phone]

## 2017-09-07 DIAGNOSIS — I10 Essential (primary) hypertension: Secondary | ICD-10-CM | POA: Diagnosis not present

## 2017-09-07 DIAGNOSIS — L03113 Cellulitis of right upper limb: Secondary | ICD-10-CM | POA: Diagnosis not present

## 2017-09-07 DIAGNOSIS — I739 Peripheral vascular disease, unspecified: Secondary | ICD-10-CM | POA: Diagnosis not present

## 2017-09-07 DIAGNOSIS — F419 Anxiety disorder, unspecified: Secondary | ICD-10-CM | POA: Diagnosis not present

## 2017-10-19 DIAGNOSIS — R413 Other amnesia: Secondary | ICD-10-CM | POA: Diagnosis not present

## 2017-10-19 DIAGNOSIS — E782 Mixed hyperlipidemia: Secondary | ICD-10-CM | POA: Diagnosis not present

## 2017-10-19 DIAGNOSIS — F5101 Primary insomnia: Secondary | ICD-10-CM | POA: Diagnosis not present

## 2017-10-19 DIAGNOSIS — M150 Primary generalized (osteo)arthritis: Secondary | ICD-10-CM | POA: Diagnosis not present

## 2017-10-19 DIAGNOSIS — I739 Peripheral vascular disease, unspecified: Secondary | ICD-10-CM | POA: Diagnosis not present

## 2017-10-19 DIAGNOSIS — F419 Anxiety disorder, unspecified: Secondary | ICD-10-CM | POA: Diagnosis not present

## 2017-10-19 DIAGNOSIS — E875 Hyperkalemia: Secondary | ICD-10-CM | POA: Diagnosis not present

## 2017-10-19 DIAGNOSIS — I10 Essential (primary) hypertension: Secondary | ICD-10-CM | POA: Diagnosis not present

## 2017-10-19 DIAGNOSIS — R5381 Other malaise: Secondary | ICD-10-CM | POA: Diagnosis not present

## 2017-10-19 DIAGNOSIS — I25810 Atherosclerosis of coronary artery bypass graft(s) without angina pectoris: Secondary | ICD-10-CM | POA: Diagnosis not present

## 2017-10-19 DIAGNOSIS — R110 Nausea: Secondary | ICD-10-CM | POA: Diagnosis not present

## 2017-10-19 DIAGNOSIS — E538 Deficiency of other specified B group vitamins: Secondary | ICD-10-CM | POA: Diagnosis not present

## 2017-11-17 DIAGNOSIS — H524 Presbyopia: Secondary | ICD-10-CM | POA: Diagnosis not present

## 2017-11-17 DIAGNOSIS — H2513 Age-related nuclear cataract, bilateral: Secondary | ICD-10-CM | POA: Diagnosis not present

## 2017-12-01 DIAGNOSIS — R5381 Other malaise: Secondary | ICD-10-CM | POA: Diagnosis not present

## 2017-12-01 DIAGNOSIS — I739 Peripheral vascular disease, unspecified: Secondary | ICD-10-CM | POA: Diagnosis not present

## 2017-12-01 DIAGNOSIS — E538 Deficiency of other specified B group vitamins: Secondary | ICD-10-CM | POA: Diagnosis not present

## 2017-12-01 DIAGNOSIS — E875 Hyperkalemia: Secondary | ICD-10-CM | POA: Diagnosis not present

## 2017-12-01 DIAGNOSIS — I252 Old myocardial infarction: Secondary | ICD-10-CM | POA: Diagnosis not present

## 2017-12-01 DIAGNOSIS — Z23 Encounter for immunization: Secondary | ICD-10-CM | POA: Diagnosis not present

## 2017-12-01 DIAGNOSIS — R110 Nausea: Secondary | ICD-10-CM | POA: Diagnosis not present

## 2017-12-01 DIAGNOSIS — R5383 Other fatigue: Secondary | ICD-10-CM | POA: Diagnosis not present

## 2017-12-01 DIAGNOSIS — F5101 Primary insomnia: Secondary | ICD-10-CM | POA: Diagnosis not present

## 2017-12-01 DIAGNOSIS — E782 Mixed hyperlipidemia: Secondary | ICD-10-CM | POA: Diagnosis not present

## 2017-12-01 DIAGNOSIS — M150 Primary generalized (osteo)arthritis: Secondary | ICD-10-CM | POA: Diagnosis not present

## 2017-12-01 DIAGNOSIS — I10 Essential (primary) hypertension: Secondary | ICD-10-CM | POA: Diagnosis not present

## 2017-12-01 DIAGNOSIS — R413 Other amnesia: Secondary | ICD-10-CM | POA: Diagnosis not present

## 2017-12-01 DIAGNOSIS — I25810 Atherosclerosis of coronary artery bypass graft(s) without angina pectoris: Secondary | ICD-10-CM | POA: Diagnosis not present

## 2017-12-18 DIAGNOSIS — I25810 Atherosclerosis of coronary artery bypass graft(s) without angina pectoris: Secondary | ICD-10-CM | POA: Diagnosis not present

## 2017-12-18 DIAGNOSIS — I252 Old myocardial infarction: Secondary | ICD-10-CM | POA: Diagnosis not present

## 2017-12-18 DIAGNOSIS — E782 Mixed hyperlipidemia: Secondary | ICD-10-CM | POA: Diagnosis not present

## 2017-12-18 DIAGNOSIS — I10 Essential (primary) hypertension: Secondary | ICD-10-CM | POA: Diagnosis not present

## 2017-12-29 DIAGNOSIS — R413 Other amnesia: Secondary | ICD-10-CM | POA: Diagnosis not present

## 2017-12-29 DIAGNOSIS — I252 Old myocardial infarction: Secondary | ICD-10-CM | POA: Diagnosis not present

## 2017-12-29 DIAGNOSIS — R0602 Shortness of breath: Secondary | ICD-10-CM | POA: Diagnosis not present

## 2017-12-29 DIAGNOSIS — R079 Chest pain, unspecified: Secondary | ICD-10-CM | POA: Diagnosis not present

## 2017-12-29 DIAGNOSIS — J069 Acute upper respiratory infection, unspecified: Secondary | ICD-10-CM | POA: Diagnosis not present

## 2017-12-29 DIAGNOSIS — R0789 Other chest pain: Secondary | ICD-10-CM | POA: Diagnosis not present

## 2017-12-29 DIAGNOSIS — J22 Unspecified acute lower respiratory infection: Secondary | ICD-10-CM | POA: Diagnosis not present

## 2018-01-05 DIAGNOSIS — J40 Bronchitis, not specified as acute or chronic: Secondary | ICD-10-CM | POA: Diagnosis not present

## 2018-01-05 DIAGNOSIS — I25810 Atherosclerosis of coronary artery bypass graft(s) without angina pectoris: Secondary | ICD-10-CM | POA: Diagnosis not present

## 2018-01-05 DIAGNOSIS — I10 Essential (primary) hypertension: Secondary | ICD-10-CM | POA: Diagnosis not present

## 2018-01-19 DIAGNOSIS — R001 Bradycardia, unspecified: Secondary | ICD-10-CM | POA: Diagnosis not present

## 2018-01-28 DIAGNOSIS — R5383 Other fatigue: Secondary | ICD-10-CM | POA: Diagnosis not present

## 2018-01-28 DIAGNOSIS — I25810 Atherosclerosis of coronary artery bypass graft(s) without angina pectoris: Secondary | ICD-10-CM | POA: Diagnosis not present

## 2018-01-28 DIAGNOSIS — J209 Acute bronchitis, unspecified: Secondary | ICD-10-CM | POA: Diagnosis not present

## 2018-01-28 DIAGNOSIS — E782 Mixed hyperlipidemia: Secondary | ICD-10-CM | POA: Diagnosis not present

## 2018-01-28 DIAGNOSIS — I10 Essential (primary) hypertension: Secondary | ICD-10-CM | POA: Diagnosis not present

## 2018-01-28 DIAGNOSIS — R5381 Other malaise: Secondary | ICD-10-CM | POA: Diagnosis not present

## 2018-01-28 DIAGNOSIS — E538 Deficiency of other specified B group vitamins: Secondary | ICD-10-CM | POA: Diagnosis not present

## 2018-02-16 DIAGNOSIS — M150 Primary generalized (osteo)arthritis: Secondary | ICD-10-CM | POA: Diagnosis not present

## 2018-03-09 DIAGNOSIS — I10 Essential (primary) hypertension: Secondary | ICD-10-CM | POA: Diagnosis not present

## 2018-03-09 DIAGNOSIS — I25810 Atherosclerosis of coronary artery bypass graft(s) without angina pectoris: Secondary | ICD-10-CM | POA: Diagnosis not present

## 2018-03-09 DIAGNOSIS — R5381 Other malaise: Secondary | ICD-10-CM | POA: Diagnosis not present

## 2018-03-09 DIAGNOSIS — F419 Anxiety disorder, unspecified: Secondary | ICD-10-CM | POA: Diagnosis not present

## 2018-03-09 DIAGNOSIS — M5136 Other intervertebral disc degeneration, lumbar region: Secondary | ICD-10-CM | POA: Diagnosis not present

## 2018-03-09 DIAGNOSIS — M150 Primary generalized (osteo)arthritis: Secondary | ICD-10-CM | POA: Diagnosis not present

## 2018-03-09 DIAGNOSIS — R413 Other amnesia: Secondary | ICD-10-CM | POA: Diagnosis not present

## 2018-03-09 DIAGNOSIS — F5101 Primary insomnia: Secondary | ICD-10-CM | POA: Diagnosis not present

## 2018-03-09 DIAGNOSIS — E782 Mixed hyperlipidemia: Secondary | ICD-10-CM | POA: Diagnosis not present

## 2018-03-09 DIAGNOSIS — E538 Deficiency of other specified B group vitamins: Secondary | ICD-10-CM | POA: Diagnosis not present

## 2018-03-09 DIAGNOSIS — R5383 Other fatigue: Secondary | ICD-10-CM | POA: Diagnosis not present

## 2018-03-09 DIAGNOSIS — I739 Peripheral vascular disease, unspecified: Secondary | ICD-10-CM | POA: Diagnosis not present

## 2018-03-12 DIAGNOSIS — M160 Bilateral primary osteoarthritis of hip: Secondary | ICD-10-CM | POA: Diagnosis not present

## 2018-03-12 DIAGNOSIS — M47816 Spondylosis without myelopathy or radiculopathy, lumbar region: Secondary | ICD-10-CM | POA: Diagnosis not present

## 2018-03-12 DIAGNOSIS — M150 Primary generalized (osteo)arthritis: Secondary | ICD-10-CM | POA: Diagnosis not present

## 2018-03-12 DIAGNOSIS — M5136 Other intervertebral disc degeneration, lumbar region: Secondary | ICD-10-CM | POA: Diagnosis not present

## 2018-03-25 DIAGNOSIS — L720 Epidermal cyst: Secondary | ICD-10-CM | POA: Diagnosis not present

## 2018-03-25 DIAGNOSIS — D485 Neoplasm of uncertain behavior of skin: Secondary | ICD-10-CM | POA: Diagnosis not present

## 2018-03-25 DIAGNOSIS — L570 Actinic keratosis: Secondary | ICD-10-CM | POA: Diagnosis not present

## 2018-03-25 DIAGNOSIS — C44519 Basal cell carcinoma of skin of other part of trunk: Secondary | ICD-10-CM | POA: Diagnosis not present

## 2018-03-31 DIAGNOSIS — M419 Scoliosis, unspecified: Secondary | ICD-10-CM | POA: Diagnosis not present

## 2018-03-31 DIAGNOSIS — M545 Low back pain: Secondary | ICD-10-CM | POA: Diagnosis not present

## 2018-03-31 DIAGNOSIS — M48061 Spinal stenosis, lumbar region without neurogenic claudication: Secondary | ICD-10-CM | POA: Diagnosis not present

## 2018-03-31 DIAGNOSIS — M5136 Other intervertebral disc degeneration, lumbar region: Secondary | ICD-10-CM | POA: Diagnosis not present

## 2018-04-07 DIAGNOSIS — I10 Essential (primary) hypertension: Secondary | ICD-10-CM | POA: Diagnosis not present

## 2018-04-07 DIAGNOSIS — J329 Chronic sinusitis, unspecified: Secondary | ICD-10-CM | POA: Diagnosis not present

## 2018-04-07 DIAGNOSIS — R5381 Other malaise: Secondary | ICD-10-CM | POA: Diagnosis not present

## 2018-04-07 DIAGNOSIS — R5383 Other fatigue: Secondary | ICD-10-CM | POA: Diagnosis not present

## 2018-04-15 DIAGNOSIS — M545 Low back pain: Secondary | ICD-10-CM | POA: Diagnosis not present

## 2018-04-20 DIAGNOSIS — S32000D Wedge compression fracture of unspecified lumbar vertebra, subsequent encounter for fracture with routine healing: Secondary | ICD-10-CM | POA: Diagnosis not present

## 2018-04-20 DIAGNOSIS — M5136 Other intervertebral disc degeneration, lumbar region: Secondary | ICD-10-CM | POA: Diagnosis not present

## 2018-04-20 DIAGNOSIS — M5126 Other intervertebral disc displacement, lumbar region: Secondary | ICD-10-CM | POA: Diagnosis not present

## 2018-04-20 DIAGNOSIS — M48061 Spinal stenosis, lumbar region without neurogenic claudication: Secondary | ICD-10-CM | POA: Diagnosis not present

## 2018-05-25 DIAGNOSIS — M5136 Other intervertebral disc degeneration, lumbar region: Secondary | ICD-10-CM | POA: Diagnosis not present

## 2018-07-05 DIAGNOSIS — Z8781 Personal history of (healed) traumatic fracture: Secondary | ICD-10-CM | POA: Diagnosis not present

## 2018-07-05 DIAGNOSIS — E782 Mixed hyperlipidemia: Secondary | ICD-10-CM | POA: Diagnosis not present

## 2018-07-05 DIAGNOSIS — M8949 Other hypertrophic osteoarthropathy, multiple sites: Secondary | ICD-10-CM | POA: Diagnosis not present

## 2018-07-05 DIAGNOSIS — F419 Anxiety disorder, unspecified: Secondary | ICD-10-CM | POA: Diagnosis not present

## 2018-07-05 DIAGNOSIS — M419 Scoliosis, unspecified: Secondary | ICD-10-CM | POA: Diagnosis not present

## 2018-07-05 DIAGNOSIS — R7309 Other abnormal glucose: Secondary | ICD-10-CM | POA: Diagnosis not present

## 2018-07-05 DIAGNOSIS — I10 Essential (primary) hypertension: Secondary | ICD-10-CM | POA: Diagnosis not present

## 2018-07-05 DIAGNOSIS — E538 Deficiency of other specified B group vitamins: Secondary | ICD-10-CM | POA: Diagnosis not present

## 2018-07-05 DIAGNOSIS — M5136 Other intervertebral disc degeneration, lumbar region: Secondary | ICD-10-CM | POA: Diagnosis not present

## 2018-07-05 DIAGNOSIS — I739 Peripheral vascular disease, unspecified: Secondary | ICD-10-CM | POA: Diagnosis not present

## 2018-07-05 DIAGNOSIS — I252 Old myocardial infarction: Secondary | ICD-10-CM | POA: Diagnosis not present

## 2018-07-05 DIAGNOSIS — R5383 Other fatigue: Secondary | ICD-10-CM | POA: Diagnosis not present

## 2018-07-05 DIAGNOSIS — R5381 Other malaise: Secondary | ICD-10-CM | POA: Diagnosis not present

## 2018-07-05 DIAGNOSIS — I25810 Atherosclerosis of coronary artery bypass graft(s) without angina pectoris: Secondary | ICD-10-CM | POA: Diagnosis not present

## 2018-07-15 DIAGNOSIS — R52 Pain, unspecified: Secondary | ICD-10-CM | POA: Diagnosis not present

## 2018-07-15 DIAGNOSIS — M545 Low back pain: Secondary | ICD-10-CM | POA: Diagnosis not present

## 2018-07-15 DIAGNOSIS — I10 Essential (primary) hypertension: Secondary | ICD-10-CM | POA: Diagnosis not present

## 2018-07-15 DIAGNOSIS — M5489 Other dorsalgia: Secondary | ICD-10-CM | POA: Diagnosis not present

## 2018-07-15 DIAGNOSIS — M546 Pain in thoracic spine: Secondary | ICD-10-CM | POA: Diagnosis not present

## 2018-07-15 DIAGNOSIS — R0902 Hypoxemia: Secondary | ICD-10-CM | POA: Diagnosis not present

## 2018-07-15 DIAGNOSIS — I25810 Atherosclerosis of coronary artery bypass graft(s) without angina pectoris: Secondary | ICD-10-CM | POA: Diagnosis not present

## 2018-07-15 DIAGNOSIS — M549 Dorsalgia, unspecified: Secondary | ICD-10-CM | POA: Diagnosis not present

## 2018-07-15 DIAGNOSIS — S3992XA Unspecified injury of lower back, initial encounter: Secondary | ICD-10-CM | POA: Diagnosis not present

## 2018-07-15 DIAGNOSIS — S2242XA Multiple fractures of ribs, left side, initial encounter for closed fracture: Secondary | ICD-10-CM | POA: Diagnosis not present

## 2018-07-15 DIAGNOSIS — S299XXA Unspecified injury of thorax, initial encounter: Secondary | ICD-10-CM | POA: Diagnosis not present

## 2018-07-22 DIAGNOSIS — S0990XA Unspecified injury of head, initial encounter: Secondary | ICD-10-CM | POA: Diagnosis not present

## 2018-07-22 DIAGNOSIS — I9589 Other hypotension: Secondary | ICD-10-CM | POA: Diagnosis not present

## 2018-07-22 DIAGNOSIS — Z8781 Personal history of (healed) traumatic fracture: Secondary | ICD-10-CM | POA: Diagnosis not present

## 2018-07-22 DIAGNOSIS — R0602 Shortness of breath: Secondary | ICD-10-CM | POA: Diagnosis not present

## 2018-07-22 DIAGNOSIS — R0781 Pleurodynia: Secondary | ICD-10-CM | POA: Diagnosis not present

## 2018-07-22 DIAGNOSIS — R0902 Hypoxemia: Secondary | ICD-10-CM | POA: Diagnosis not present

## 2018-07-22 DIAGNOSIS — R52 Pain, unspecified: Secondary | ICD-10-CM | POA: Diagnosis not present

## 2018-07-22 DIAGNOSIS — R05 Cough: Secondary | ICD-10-CM | POA: Diagnosis not present

## 2018-07-22 DIAGNOSIS — J189 Pneumonia, unspecified organism: Secondary | ICD-10-CM | POA: Diagnosis not present

## 2018-07-22 DIAGNOSIS — R4182 Altered mental status, unspecified: Secondary | ICD-10-CM | POA: Diagnosis not present

## 2018-07-22 DIAGNOSIS — I739 Peripheral vascular disease, unspecified: Secondary | ICD-10-CM | POA: Diagnosis not present

## 2018-07-22 DIAGNOSIS — R413 Other amnesia: Secondary | ICD-10-CM | POA: Diagnosis not present

## 2018-07-22 DIAGNOSIS — S2232XA Fracture of one rib, left side, initial encounter for closed fracture: Secondary | ICD-10-CM | POA: Diagnosis not present

## 2018-07-22 DIAGNOSIS — J168 Pneumonia due to other specified infectious organisms: Secondary | ICD-10-CM | POA: Diagnosis not present

## 2018-07-22 DIAGNOSIS — J90 Pleural effusion, not elsewhere classified: Secondary | ICD-10-CM | POA: Diagnosis not present

## 2018-07-22 DIAGNOSIS — W19XXXA Unspecified fall, initial encounter: Secondary | ICD-10-CM | POA: Diagnosis not present

## 2018-07-23 ENCOUNTER — Encounter (HOSPITAL_COMMUNITY): Payer: PPO

## 2018-07-23 ENCOUNTER — Inpatient Hospital Stay (HOSPITAL_COMMUNITY)
Admission: AD | Admit: 2018-07-23 | Discharge: 2018-07-26 | DRG: 199 | Disposition: A | Discharge status: Home / Self Care | Payer: PPO | Source: Other Acute Inpatient Hospital | Attending: Internal Medicine | Admitting: Internal Medicine

## 2018-07-23 ENCOUNTER — Encounter (HOSPITAL_COMMUNITY): Payer: Self-pay | Admitting: Internal Medicine

## 2018-07-23 DIAGNOSIS — J9601 Acute respiratory failure with hypoxia: Secondary | ICD-10-CM | POA: Diagnosis not present

## 2018-07-23 DIAGNOSIS — Z79899 Other long term (current) drug therapy: Secondary | ICD-10-CM

## 2018-07-23 DIAGNOSIS — I2510 Atherosclerotic heart disease of native coronary artery without angina pectoris: Secondary | ICD-10-CM | POA: Diagnosis not present

## 2018-07-23 DIAGNOSIS — G9341 Metabolic encephalopathy: Secondary | ICD-10-CM | POA: Diagnosis present

## 2018-07-23 DIAGNOSIS — I10 Essential (primary) hypertension: Secondary | ICD-10-CM | POA: Diagnosis not present

## 2018-07-23 DIAGNOSIS — J189 Pneumonia, unspecified organism: Secondary | ICD-10-CM | POA: Diagnosis not present

## 2018-07-23 DIAGNOSIS — J942 Hemothorax: Secondary | ICD-10-CM | POA: Diagnosis present

## 2018-07-23 DIAGNOSIS — S272XXA Traumatic hemopneumothorax, initial encounter: Secondary | ICD-10-CM | POA: Diagnosis not present

## 2018-07-23 DIAGNOSIS — J939 Pneumothorax, unspecified: Secondary | ICD-10-CM

## 2018-07-23 DIAGNOSIS — R7989 Other specified abnormal findings of blood chemistry: Secondary | ICD-10-CM | POA: Diagnosis not present

## 2018-07-23 DIAGNOSIS — Z20828 Contact with and (suspected) exposure to other viral communicable diseases: Secondary | ICD-10-CM | POA: Diagnosis present

## 2018-07-23 DIAGNOSIS — Z7982 Long term (current) use of aspirin: Secondary | ICD-10-CM | POA: Diagnosis not present

## 2018-07-23 DIAGNOSIS — S271XXA Traumatic hemothorax, initial encounter: Secondary | ICD-10-CM | POA: Diagnosis not present

## 2018-07-23 DIAGNOSIS — E785 Hyperlipidemia, unspecified: Secondary | ICD-10-CM | POA: Diagnosis not present

## 2018-07-23 DIAGNOSIS — F0390 Unspecified dementia without behavioral disturbance: Secondary | ICD-10-CM | POA: Diagnosis not present

## 2018-07-23 DIAGNOSIS — S2232XA Fracture of one rib, left side, initial encounter for closed fracture: Secondary | ICD-10-CM | POA: Diagnosis present

## 2018-07-23 DIAGNOSIS — F329 Major depressive disorder, single episode, unspecified: Secondary | ICD-10-CM | POA: Diagnosis not present

## 2018-07-23 DIAGNOSIS — J9690 Respiratory failure, unspecified, unspecified whether with hypoxia or hypercapnia: Secondary | ICD-10-CM | POA: Diagnosis not present

## 2018-07-23 DIAGNOSIS — S2249XA Multiple fractures of ribs, unspecified side, initial encounter for closed fracture: Secondary | ICD-10-CM

## 2018-07-23 DIAGNOSIS — J9600 Acute respiratory failure, unspecified whether with hypoxia or hypercapnia: Secondary | ICD-10-CM | POA: Diagnosis not present

## 2018-07-23 DIAGNOSIS — S2242XA Multiple fractures of ribs, left side, initial encounter for closed fracture: Secondary | ICD-10-CM | POA: Diagnosis present

## 2018-07-23 DIAGNOSIS — K219 Gastro-esophageal reflux disease without esophagitis: Secondary | ICD-10-CM | POA: Diagnosis not present

## 2018-07-23 DIAGNOSIS — W19XXXA Unspecified fall, initial encounter: Secondary | ICD-10-CM | POA: Diagnosis present

## 2018-07-23 DIAGNOSIS — Z951 Presence of aortocoronary bypass graft: Secondary | ICD-10-CM | POA: Diagnosis not present

## 2018-07-23 DIAGNOSIS — R945 Abnormal results of liver function studies: Secondary | ICD-10-CM | POA: Diagnosis not present

## 2018-07-23 DIAGNOSIS — Y929 Unspecified place or not applicable: Secondary | ICD-10-CM | POA: Diagnosis not present

## 2018-07-23 DIAGNOSIS — J90 Pleural effusion, not elsewhere classified: Secondary | ICD-10-CM | POA: Diagnosis not present

## 2018-07-23 DIAGNOSIS — W11XXXA Fall on and from ladder, initial encounter: Secondary | ICD-10-CM | POA: Diagnosis present

## 2018-07-23 DIAGNOSIS — F32A Depression, unspecified: Secondary | ICD-10-CM | POA: Diagnosis present

## 2018-07-23 HISTORY — DX: Essential (primary) hypertension: I10

## 2018-07-23 HISTORY — DX: Hyperlipidemia, unspecified: E78.5

## 2018-07-23 HISTORY — DX: Depression, unspecified: F32.A

## 2018-07-23 HISTORY — DX: Gastro-esophageal reflux disease without esophagitis: K21.9

## 2018-07-23 LAB — CBC WITH DIFFERENTIAL/PLATELET
Abs Immature Granulocytes: 0.05 10*3/uL (ref 0.00–0.07)
Basophils Absolute: 0.1 10*3/uL (ref 0.0–0.1)
Basophils Relative: 1 %
Eosinophils Absolute: 0.8 10*3/uL — ABNORMAL HIGH (ref 0.0–0.5)
Eosinophils Relative: 7 %
HCT: 36.1 % — ABNORMAL LOW (ref 39.0–52.0)
Hemoglobin: 11.8 g/dL — ABNORMAL LOW (ref 13.0–17.0)
Immature Granulocytes: 1 %
Lymphocytes Relative: 11 %
Lymphs Abs: 1.1 10*3/uL (ref 0.7–4.0)
MCH: 30.2 pg (ref 26.0–34.0)
MCHC: 32.7 g/dL (ref 30.0–36.0)
MCV: 92.3 fL (ref 80.0–100.0)
Monocytes Absolute: 1 10*3/uL (ref 0.1–1.0)
Monocytes Relative: 9 %
Neutro Abs: 7.8 10*3/uL — ABNORMAL HIGH (ref 1.7–7.7)
Neutrophils Relative %: 71 %
Platelets: 367 10*3/uL (ref 150–400)
RBC: 3.91 MIL/uL — ABNORMAL LOW (ref 4.22–5.81)
RDW: 12.2 % (ref 11.5–15.5)
WBC: 10.9 10*3/uL — ABNORMAL HIGH (ref 4.0–10.5)
nRBC: 0 % (ref 0.0–0.2)

## 2018-07-23 LAB — COMPREHENSIVE METABOLIC PANEL
ALT: 126 U/L — ABNORMAL HIGH (ref 0–44)
AST: 89 U/L — ABNORMAL HIGH (ref 15–41)
Albumin: 2.4 g/dL — ABNORMAL LOW (ref 3.5–5.0)
Alkaline Phosphatase: 158 U/L — ABNORMAL HIGH (ref 38–126)
Anion gap: 9 (ref 5–15)
BUN: 15 mg/dL (ref 8–23)
CO2: 28 mmol/L (ref 22–32)
Calcium: 8.9 mg/dL (ref 8.9–10.3)
Chloride: 98 mmol/L (ref 98–111)
Creatinine, Ser: 0.81 mg/dL (ref 0.61–1.24)
GFR calc Af Amer: >60 mL/min (ref >60)
GFR calc non Af Amer: >60 mL/min (ref >60)
Glucose, Bld: 106 mg/dL — ABNORMAL HIGH (ref 70–99)
Potassium: 4.4 mmol/L (ref 3.5–5.1)
Sodium: 135 mmol/L (ref 135–145)
Total Bilirubin: 0.7 mg/dL (ref 0.3–1.2)
Total Protein: 5.9 g/dL — ABNORMAL LOW (ref 6.5–8.1)

## 2018-07-23 LAB — RESPIRATORY PANEL BY PCR

## 2018-07-23 LAB — GLUCOSE, CAPILLARY: Glucose-Capillary: 110 mg/dL — ABNORMAL HIGH (ref 70–99)

## 2018-07-23 LAB — INFLUENZA PANEL BY PCR (TYPE A & B)
Influenza A By PCR: NEGATIVE
Influenza B By PCR: NEGATIVE

## 2018-07-23 LAB — STREP PNEUMONIAE URINARY ANTIGEN: Strep Pneumo Urinary Antigen: NEGATIVE

## 2018-07-23 LAB — PROTIME-INR
INR: 1.2 (ref 0.8–1.2)
Prothrombin Time: 15 seconds (ref 11.4–15.2)

## 2018-07-23 LAB — TYPE AND SCREEN
ABO/RH(D): O POS
Antibody Screen: NEGATIVE

## 2018-07-23 LAB — HIV ANTIBODY (ROUTINE TESTING W REFLEX): HIV Screen 4th Generation wRfx: NONREACTIVE

## 2018-07-23 LAB — SARS CORONAVIRUS 2 BY RT PCR (HOSPITAL ORDER, PERFORMED IN ~~LOC~~ HOSPITAL LAB): SARS Coronavirus 2: NEGATIVE

## 2018-07-23 LAB — MRSA PCR SCREENING: MRSA by PCR: NEGATIVE

## 2018-07-23 LAB — APTT: aPTT: 33 seconds (ref 24–36)

## 2018-07-23 LAB — MAGNESIUM: Magnesium: 2.2 mg/dL (ref 1.7–2.4)

## 2018-07-23 LAB — ABO/RH: ABO/RH(D): O POS

## 2018-07-23 MED ORDER — ONDANSETRON HCL 4 MG PO TABS: 4.0000 mg | ORAL_TABLET | Freq: Four times a day (QID) | ORAL | Status: DC | PRN

## 2018-07-23 MED ORDER — MORPHINE SULFATE (PF) 2 MG/ML IV SOLN
2.0000 mg | INTRAVENOUS | Status: DC | PRN
  Administered 2018-07-23 – 2018-07-24 (×3): 2 mg via INTRAVENOUS
  Filled 2018-07-23 (×3): qty 1

## 2018-07-23 MED ORDER — ACETAMINOPHEN 325 MG PO TABS: 650.0000 mg | ORAL_TABLET | Freq: Four times a day (QID) | ORAL | Status: DC | PRN

## 2018-07-23 MED ORDER — ACETAMINOPHEN 650 MG RE SUPP: 650.0000 mg | Freq: Four times a day (QID) | RECTAL | Status: DC | PRN

## 2018-07-23 MED ORDER — OXYCODONE-ACETAMINOPHEN 5-325 MG PO TABS: 1.0000 | ORAL_TABLET | ORAL | Status: DC | PRN

## 2018-07-23 MED ORDER — METOPROLOL TARTRATE 12.5 MG HALF TABLET
12.5000 mg | ORAL_TABLET | Freq: Two times a day (BID) | ORAL | Status: DC
  Administered 2018-07-23 – 2018-07-26 (×6): 12.5 mg via ORAL
  Filled 2018-07-23 (×7): qty 1

## 2018-07-23 MED ORDER — METHOCARBAMOL 500 MG PO TABS
750.0000 mg | ORAL_TABLET | Freq: Three times a day (TID) | ORAL | Status: DC | PRN
  Administered 2018-07-23: 750 mg via ORAL
  Filled 2018-07-23 (×2): qty 1

## 2018-07-23 MED ORDER — ONDANSETRON HCL 4 MG/2ML IJ SOLN: 4.0000 mg | Freq: Four times a day (QID) | INTRAMUSCULAR | Status: DC | PRN

## 2018-07-23 MED ORDER — PANTOPRAZOLE SODIUM 40 MG PO TBEC
40.0000 mg | DELAYED_RELEASE_TABLET | Freq: Every day | ORAL | Status: DC
  Administered 2018-07-23 – 2018-07-26 (×4): 40 mg via ORAL
  Filled 2018-07-23 (×4): qty 1

## 2018-07-23 MED ORDER — ESCITALOPRAM OXALATE 10 MG PO TABS
30.0000 mg | ORAL_TABLET | Freq: Every day | ORAL | Status: DC
  Administered 2018-07-23 – 2018-07-26 (×4): 30 mg via ORAL
  Filled 2018-07-23 (×4): qty 3

## 2018-07-23 MED ORDER — ROSUVASTATIN CALCIUM 20 MG PO TABS
40.0000 mg | ORAL_TABLET | Freq: Every day | ORAL | Status: DC
  Administered 2018-07-23 – 2018-07-26 (×4): 40 mg via ORAL
  Filled 2018-07-23 (×5): qty 2

## 2018-07-23 MED ORDER — DM-GUAIFENESIN ER 30-600 MG PO TB12
1.0000 | ORAL_TABLET | Freq: Two times a day (BID) | ORAL | Status: DC | PRN
  Filled 2018-07-23: qty 1

## 2018-07-23 MED ORDER — GABAPENTIN 300 MG PO CAPS
300.0000 mg | ORAL_CAPSULE | Freq: Every evening | ORAL | Status: DC | PRN
  Filled 2018-07-23: qty 1

## 2018-07-23 MED ORDER — GABAPENTIN 300 MG PO CAPS: 300.0000 mg | ORAL_CAPSULE | Freq: Every day | ORAL | Status: DC

## 2018-07-23 MED ORDER — HYDRALAZINE HCL 20 MG/ML IJ SOLN: 5.0000 mg | INTRAMUSCULAR | Status: DC | PRN

## 2018-07-23 MED ORDER — GABAPENTIN 300 MG PO CAPS
300.0000 mg | ORAL_CAPSULE | Freq: Every day | ORAL | Status: DC
  Administered 2018-07-23 – 2018-07-25 (×3): 300 mg via ORAL
  Filled 2018-07-23 (×4): qty 1

## 2018-07-23 MED ORDER — OXYCODONE HCL 5 MG PO TABS
5.0000 mg | ORAL_TABLET | Freq: Four times a day (QID) | ORAL | Status: DC | PRN
  Administered 2018-07-24 – 2018-07-26 (×5): 5 mg via ORAL
  Filled 2018-07-23 (×5): qty 1

## 2018-07-23 MED ORDER — TRAMADOL HCL 50 MG PO TABS
50.0000 mg | ORAL_TABLET | Freq: Four times a day (QID) | ORAL | Status: DC
  Administered 2018-07-23 – 2018-07-26 (×13): 50 mg via ORAL
  Filled 2018-07-23 (×13): qty 1

## 2018-07-23 MED ORDER — LOSARTAN POTASSIUM 50 MG PO TABS
100.0000 mg | ORAL_TABLET | Freq: Every day | ORAL | Status: DC
  Administered 2018-07-23 – 2018-07-26 (×4): 100 mg via ORAL
  Filled 2018-07-23 (×4): qty 2

## 2018-07-23 MED ORDER — ALBUTEROL SULFATE HFA 108 (90 BASE) MCG/ACT IN AERS: 2.0000 | INHALATION_SPRAY | RESPIRATORY_TRACT | Status: DC | PRN

## 2018-07-23 MED ORDER — SENNOSIDES-DOCUSATE SODIUM 8.6-50 MG PO TABS: 1.0000 | ORAL_TABLET | Freq: Every evening | ORAL | Status: DC | PRN

## 2018-07-23 MED ORDER — AMLODIPINE BESYLATE 2.5 MG PO TABS
2.5000 mg | ORAL_TABLET | Freq: Every day | ORAL | Status: DC
  Administered 2018-07-23 – 2018-07-26 (×4): 2.5 mg via ORAL
  Filled 2018-07-23 (×4): qty 1

## 2018-07-23 NOTE — Care Management (Signed)
This is a no charge note  Transfer from [place] to [facility] progress bed per Dr. Jenita Seashore, Male, 68 y.o., 1950/11/21, MRN: [id number]  68 year old man with past medical history of hypertension, hyperlipidemia, GERD, CAD, mild dementia, who presents with altered mental status.  Patient had fall 1 week ago, caused left rib fracture (fourth-ninth), small left pneumothorax (<1.0 cm) and moderate hemothorax.  Patient is more confused than his baseline, currently still talking and is alert per ED physician.  Patient was found to have oxygen desaturation to 80% on room air, which improved to 95% on 2 L oxygen.  CT of the chest that showed bilateral atypical infiltration with groundglass pattern, suspecting viral infection, such as COVID-19 infection, but COVID19 test is negative. Dr. Elenore Rota discussed case with on-call MD in [facility] who recommended to keep pt there and repeat the COVID19 test, but they do not have appropriate PPE, and also pt has other condition and therefore needs to be transferred. Trauma surgeon, Dr. Brantley Stage was consulted, who agreed to consult on this case.   Patient was found to have negative urinalysis, WBC 8.8, hemoglobin 11.9 (14.8 on 09/04/2017), temperature normal, blood pressure 137/65, heart rate 61, RR 20, oxygen saturation 95% on 2 L oxygen.  CT head is negative for acute intracranial abnormalities. CT of C-spine was not done.  Patient is accepted to progressive bed as inpatient.  Please call manager of [facility] at [phone] when pt arrives to floor   Ivor Costa, MD  [facility]   If 7PM-7AM, please contact night-coverage [email or web address] Password [facility] 07/23/2018, 12:07 AM

## 2018-07-23 NOTE — H&P (Addendum)
History and Physical    HAKIEM Rowland LNL:[id number] DOB: 1951-01-07 DOA: 07/23/2018  Referring MD/NP/PA:   PCP: Raina Mina., MD   Patient coming from:  The patient is coming from home.  At baseline, pt is independent for most of ADL.        Chief Complaint: fall, chest wall pain and AMS  HPI: Carlos Rowland is a 68 y.o. male with medical history significant of hypertension, hyperlipidemia, GERD, CAD, mild dementia, who presents with altered mental status.  Pt is transferred from [facility]  Patient had fall 1 week ago when was working on [employer] ladder, caused left rib fracture (fourth-ninth), a tiny left pneumothorax and small to moderate hemothorax per EDP. No LOC. Pt was noted to be more confused than his baseline. He was seen in [facility] initially.   Patient was found to have oxygen desaturation to 80% on room air, which improved to 95% on 2 L oxygen.  CTA of chest negative for PE, but showed bilateral atypical infiltration with groundglass pattern, suspecting viral infection, such as COVID-19 infection, but COVID19 test is negative. Also showed rib fracture (4th to 9th). Dr. Elenore Rota discussed case with on-call MD in [facility] who recommended to keep pt there and repeat the COVID19 test, but they do not have appropriate PPE, and also pt has other condition and therefore needs to be transferred. Trauma surgeon, Dr. Brantley Stage was consulted, who agreed to consult on this case.  Patient was found to have negative urinalysis, WBC 8.8, hemoglobin 11.9 (14.8 on 09/04/2017), lactic acid of 0.9, negative troponin, proBNP 2 4, abnormal liver function (ALP 180, AST 2 8, ALT 212, total bilirubin 0.6), CRP 247.5, LDH 933, ferritin>1000. Temperature normal, blood pressure 137/65, heart rate 61, RR 20, oxygen saturation 95% on 2 L oxygen.  CT head is negative for acute intracranial abnormalities. CT of C-spine was not done.   At arrival to the floor, his mental status improved.  He is alert,  oriented x3.  He moves all extremities normally.  Patient reports severe left-sided chest pain, which is constant, sharp, 10 out of 10 in severity.  Patient denies shortness of breath.  He has a cough with a little yellow-colored mucus production.  No fever or chills.  Denies nausea vomiting, diarrhea, abdominal pain, symptoms of UTI.  Oxygen saturation 97% on room air, temperature normal, heart rate 61, respiration rate 14, blood pressure 116/72.  Patient is admitted to stepdown bed as inpatient.  Review of Systems:   General: no fevers, chills, no body weight gain, has has fatigue HEENT: no blurry vision, hearing changes or sore throat Respiratory: no dyspnea, has coughing, no wheezing CV: has chest pain, no palpitations GI: no nausea, vomiting, abdominal pain, diarrhea, constipation GU: no dysuria, burning on urination, increased urinary frequency, hematuria  Ext: no leg edema Neuro: no unilateral weakness, numbness, or tingling, no vision change or hearing loss. Had confusion. Skin: no rash, no skin tear. MSK: No muscle spasm, no deformity, no limitation of range of movement in spin Heme: No easy bruising.  Travel history: No recent long distant travel.  Allergy: No Known Allergies  Past Medical History:  Diagnosis Date  . Coronary artery disease   . Depression   . GERD (gastroesophageal reflux disease)   . HLD (hyperlipidemia)   . HTN (hypertension)   . Hypertension     Past Surgical History:  Procedure Laterality Date  . CARDIAC SURGERY    . CORONARY ARTERY BYPASS GRAFT    .  HERNIA REPAIR      Social History:  reports that he has never smoked. He has never used smokeless tobacco. He reports previous alcohol use. He reports that he does not use drugs.  Family History: reviewed with pt, patient states that both parents passed away, he does not know any family medical history.  Prior to Admission medications   Medication Sig Start Date End Date Taking? Authorizing Provider   amLODipine (NORVASC) 2.5 MG tablet Take 2.5 mg by mouth daily. 06/29/17   [provider]  aspirin 81 MG chewable tablet Chew 81 mg by mouth daily. 12/09/14   [provider]  escitalopram (LEXAPRO) 20 MG tablet Take 20 mg by mouth daily. 05/12/17   [provider]  gabapentin (NEURONTIN) 300 MG capsule Take 300-600 mg by mouth at bedtime. 08/26/17   [provider]  losartan (COZAAR) 100 MG tablet Take 100 mg by mouth daily. 08/04/17   [provider]  metoprolol tartrate (LOPRESSOR) 25 MG tablet Take 25 mg by mouth 2 (two) times daily. 08/16/17   [provider]  pantoprazole (PROTONIX) 40 MG tablet Take 40 mg by mouth daily. 08/26/17   [provider]  promethazine (PHENERGAN) 25 MG tablet Take 25 mg by mouth every 6 (six) hours as needed for nausea. 07/29/17   [provider]  rosuvastatin (CRESTOR) 40 MG tablet Take 40 mg by mouth daily. 06/12/17   [provider]  traMADol (ULTRAM) 50 MG tablet Take 50 mg by mouth 2 (two) times daily as needed. 08/26/17   [provider]    Physical Exam: Vitals:   07/23/18 0549 07/23/18 0600  BP:  116/72  Pulse: 62 61  Resp: (!) 21 14  Temp: 98.4 F (36.9 C)   TempSrc: Oral   SpO2: 96% 97%   General: Not in acute distress HEENT:       Eyes: PERRL, EOMI, no scleral icterus.       ENT: No discharge from the ears and nose, no pharynx injection, no tonsillar enlargement.        Neck: No JVD, no bruit, no mass felt. Heme: No neck lymph node enlargement. Cardiac: S1/S2, RRR, No murmurs, No gallops or rubs. Respiratory: No rales, wheezing, rhonchi or rubs. Has chest wall tenderness on the left side. GI: Soft, nondistended, nontender, no rebound pain, no organomegaly, BS present. GU: No hematuria Ext: No pitting leg edema bilaterally. 2+DP/PT pulse bilaterally. Musculoskeletal: No joint deformities, No joint redness or warmth, no limitation of ROM in spin. Skin: No  rashes.  Neuro: Alert, oriented X3, cranial nerves II-XII grossly intact, moves all extremities normally.   Psych: Patient is not psychotic, no suicidal or hemocidal ideation.  Labs on Admission: I have personally reviewed following labs and imaging studies  CBC: No results for input(s): WBC, NEUTROABS, HGB, HCT, MCV, PLT in the last 168 hours. Basic Metabolic Panel: No results for input(s): NA, K, CL, CO2, GLUCOSE, BUN, CREATININE, CALCIUM, MG, PHOS in the last 168 hours. GFR: CrCl cannot be calculated (Patient's most recent lab result is older than the maximum 21 days allowed.). Liver Function Tests: No results for input(s): AST, ALT, ALKPHOS, BILITOT, PROT, ALBUMIN in the last 168 hours. No results for input(s): LIPASE, AMYLASE in the last 168 hours. No results for input(s): AMMONIA in the last 168 hours. Coagulation Profile: Recent Labs  Lab 07/23/18 0614  INR 1.2   Cardiac Enzymes: No results for input(s): CKTOTAL, CKMB, CKMBINDEX, TROPONINI in the last 168 hours.  BNP (last 3 results) No results for input(s): PROBNP in the last 8760 hours. HbA1C: No results for input(s): HGBA1C in the last 72 hours. CBG: Recent Labs  Lab 07/23/18 0631  GLUCAP 110*   Lipid Profile: No results for input(s): CHOL, HDL, LDLCALC, TRIG, CHOLHDL, LDLDIRECT in the last 72 hours. Thyroid Function Tests: No results for input(s): TSH, T4TOTAL, FREET4, T3FREE, THYROIDAB in the last 72 hours. Anemia Panel: No results for input(s): VITAMINB12, FOLATE, FERRITIN, TIBC, IRON, RETICCTPCT in the last 72 hours. Urine analysis: No results found for: COLORURINE, APPEARANCEUR, LABSPEC, PHURINE, GLUCOSEU, HGBUR, BILIRUBINUR, KETONESUR, PROTEINUR, UROBILINOGEN, NITRITE, LEUKOCYTESUR Sepsis Labs: @LABRCNTIP (procalcitonin:4,lacticidven:4) )No results found for this or any previous visit (from the past 240 hour(s)).   Radiological Exams on Admission: No results found.   EKG: Independently reviewed.  Not  done in ED, will get one.   Assessment/Plan Principal Problem:   Hemothorax Active Problems:   Coronary artery disease   Depression   GERD (gastroesophageal reflux disease)   HLD (hyperlipidemia)   HTN (hypertension)   Hypertension   Fall   Pneumothorax   Acute metabolic encephalopathy   Abnormal liver function   Atypical pneumonia   Left rib fracture   Hemothorax and pneumothorax and rib fracture: pt has Small to moderate amount of hemothorax and a tiny amount of pneumothorax. Pt had oxygen desaturation earlier, but currently his saturation is 97% on room air.  Patient complains of severe left-sided chest wall pain.  Trauma surgeon, Dr. Brantley Stage was consulted by EDP, nurse will inform Dr. Brantley Stage of patient's arrival.  -will admit to SDU as inpt -Pain control: PRN morphine and oxycodone -Incentive spirometry -f/u surgeon's recommendation which would be highly appreciated.  Coronary artery disease: s/p of CABG. Trop negative. -continue metoprolol and Crestor -Hold aspirin due to pneumothorax  Possible atypical pneumonia: Patient states that he has some mild cough with little yellow-colored mucus production.  CT angiogram showed atypical ground glass pattern for infiltration.  Suspected COVID-19, but COVID-19 test was negative.  Given patient has abnormal findings of CRP, LDH, ferritin, will repeat COVID-19 test.  Does not have fever or leukocytosis.  Clinically not septic.  Will hold off antibiotics now.  Patient had oxygen desaturation earlier, which may be partially related to rib fracture and pain.  -will repeat COVID-19 test.  I will not order other related tests at this moment.  If confirmed positive, will need to do further work-up. -Follow-up flu PCR, RVP -As needed albuterol inhaler and Mucinex for cough - will check LE doppler to r/o DVT due to positive D-dimer  Physician PPE: face shield, surgical mask (N95 shortage), gown, glove Patient PPE: none. No coughing. Fever:  no Cough: yes SOB: no URI symptoms: no GI symptoms: no Travel: no Sick contacts: no CBC: leukopenia, lymphopenia-->no BMP: increased BUN/Cr-->no LFTs: increased AST/ALT/Tbili-->yes CRP, LDH: increased-->yes Procalcitonin: not done CT chest: GGO, consolidation, crazy paving-->yes COVID subjective risk assessment: low COVID Testing: indicated per current ID/[place] guidelines-->yes Precaution: Droplet and contact  GERD: -Protonix  HLD (hyperlipidemia):  -crestor  HTN:  -Continue home medications: Losartan, metoprolol, amlodipine -IV hydralazine prn  Depression: -Lexapro  Fall: Seems to be an accident.  CT head is negative for acute intracranial abnormalities.  No CT of C-spine was done.  Patient denies neck pain. -Observe  Acute metabolic encephalopathy: seems have resolved.  Currently patient is alert, oriented x3.  No focal neurologic findings on physical examination. -Frequent neuro check  Abnormal liver function:  -check hepatitis panel and HIV  antibody -Avoid using liver toxic medications such as Tylenol  Inpatient status:  # Patient requires inpatient status due to high intensity of service, high risk for further deterioration and high frequency of surveillance required.  I certify that at the point of admission it is my clinical judgment that the patient will require inpatient hospital care spanning beyond 2 midnights from the point of admission.  . This patient has multiple chronic comorbidities including hypertension, hyperlipidemia, GERD, CAD, mild dementia. . Now patient has presenting with AMS, Fall, pneumothorax, hemothorax, oxygen desaturation, possible atypical pneumonia . The worrisome physical exam findings include tenderness in the left chest wall . The initial radiographic and laboratory data are worrisome because of pneumothorax, hemothorax, abnormal liver function . Current medical needs: please see my assessment and plan . Predictability of an  adverse outcome (risk): Patient has multiple comorbidities as listed above, now presents with pneumothorax, hemothorax, possible atypical pneumonia which needs to have further work-up.  Patient's presentation is highly complicated.  Will need to be treated in hospital for at least 2 days.    DVT ppx: SCD Code Status: Full code Family Communication: None at bed side.   Disposition Plan:  Anticipate discharge back to previous home environment Consults called:  Dr. Brantley Stage of surgeon Admission status:   SDU/inpation       Date of Service 07/23/2018    [facility]   If 7PM-7AM, please contact night-coverage [email or web address] Password [facility] 07/23/2018, 7:24 AM

## 2018-07-23 NOTE — Progress Notes (Signed)
Carlos Rowland  PROGRESS NOTE    Carlos Rowland  OHY:[id number] DOB: [birth year] DOA: 07/23/2018 PCP: Carlos Rowland., MD   Brief Narrative:   Carlos Rowland is a 68 y.o. male with medical history significant of hypertension, hyperlipidemia, GERD, CAD, mild dementia, who presents with altered mental status.  Pt is transferred from [facility]  Patient had fall 1 week ago when was working on [employer] ladder, caused left rib fracture (fourth-ninth), atiny left pneumothoraxand small to moderate hemothorax per EDP. No LOC. Pt was noted to be more confused than his baseline. He was seen in [facility] initially.   Patient was found to have oxygen desaturation to 80% on room air,which improved to 95% on 2 L oxygen. CTA of chest negative for PE, but showed bilateral atypical infiltration with groundglass pattern, suspectingviral infection, such as COVID-19infection,but COVID19test is negative. Also showed rib fracture (4th to 9th). Dr.Osborn discussed case withon-call MD in [place] who recommended to keep pt there and repeat the COVID19 test, but they do not haveappropriatePPE, and also pt has othercondition and thereforeneedsto be transferred. Trauma surgeon, Dr. Brantley Stage was consulted, who agreed to consult on this case.  Patient was found to have negative urinalysis, WBC 8.8, hemoglobin 11.9 (14.8 on 09/04/2017), lactic acid of 0.9, negative troponin, proBNP 2 4, abnormal liver function (ALP 180, AST 2 8, ALT 212, total bilirubin 0.6), CRP 247.5, LDH 933, ferritin>1000. Temperature normal, blood pressure 137/65, heart rate 61, RR 20, oxygen saturation 95% on 2 L oxygen. CT head is negative for acute intracranial abnormalities. CT of C-spine was not done.   At arrival to the floor, his mental status improved.  He is alert, oriented x3.  He moves all extremities normally.  Patient reports severe left-sided chest pain, which is constant, sharp, 10 out of 10 in severity.  Patient denies  shortness of breath.  He has a cough with a little yellow-colored mucus production.  No fever or chills.  Denies nausea vomiting, diarrhea, abdominal pain, symptoms of UTI.  Oxygen saturation 97% on room air, temperature normal, heart rate 61, respiration rate 14, blood pressure 116/72.  Patient is admitted to stepdown    Assessment & Plan:   Principal Problem:   Hemothorax Active Problems:   Coronary artery disease   Depression   GERD (gastroesophageal reflux disease)   HLD (hyperlipidemia)   HTN (hypertension)   Hypertension   Fall   Pneumothorax   Acute metabolic encephalopathy   Abnormal liver function   Atypical pneumonia   Left rib fracture   Hemothorax and pneumothorax and rib fracture:      - pt has Small to moderate amount of hemothorax and a tiny amount of pneumothorax.      - Pt had oxygen desaturation earlier, but currently his saturation is 97% on room air.       - Patient complains of severe left-sided chest wall pain.  Trauma surgeon, Dr. Brantley Stage was consulted by EDP, nurse will inform Dr. Brantley Stage of patient's arrival.     - Pain control: PRN morphine and oxycodone     - Incentive spirometry     - f/u surgeon's recommendation which would be highly appreciated.     - can downgrade to progressive  Coronary artery disease: s/p of CABG. Trop negative.     - continue metoprolol and Crestor     - Hold aspirin due to pneumothorax  Possible atypical pneumonia:      - Patient states that he has some mild  cough with little yellow-colored mucus production.       - CT angiogram showed atypical ground glass pattern for infiltration.       - COVID 19 negative x 2      - Patient had oxygen desaturation earlier, which may be partially related to rib fracture and pain.     - Continue to hold abx for now    - viral resp panel negative, flu negative     - As needed albuterol inhaler and Mucinex for cough  GERD:     -Protonix  HLD (hyperlipidemia):      -crestor   HTN:      - Continue home medications: Losartan, metoprolol, amlodipine     - IV hydralazine prn  Depression:     - Lexapro  Fall:      - Seems to be an accident (bent ladder came out from underneath him).       - CT head is negative for acute intracranial abnormalities.     - Patient denies neck pain.   Acute metabolic encephalopathy:      - seems have resolved.       - Currently patient is alert, oriented x3.  No focal neurologic findings on physical examination.     - Frequent neuro check  Abnormal liver function:      - check hepatitis panel and HIV antibody     - Avoid using liver toxic medications such as Tylenol   DVT prophylaxis: SCDs Code Status: FULL Family Communication: None at bedside   Disposition Plan: TBD   Consultants:   [place]    Subjective: "I shouldn't have got on that ladder."  Objective: Vitals:   07/23/18 1525 07/23/18 1526 07/23/18 1632 07/23/18 1635  BP: (!) 102/59  109/69   Pulse:  62    Resp: 18 (!) 22 12   Temp:   98.7 F (37.1 C)   TempSrc:   Oral   SpO2:  99% (!) 87% 95%    Intake/Output Summary (Last 24 hours) at 07/23/2018 1900 Last data filed at 07/23/2018 1533 Gross per 24 hour  Intake -  Output 1075 ml  Net -1075 ml   There were no vitals filed for this visit.  Examination:  .General: 68 y.o. male resting in bed in NAD Cardiovascular: RRR, +S1, S2, no m/g/r, equal pulses throughout Respiratory: shallow breathing d/t pain, clear anteriorly GI: BS+, NDNT, no masses noted, no organomegaly noted MSK: No e/c/c Skin: No rashes, bruises, ulcerations noted Neuro: A&O x 3, no focal deficits Psyc: Appropriate interaction and affect, calm/cooperative     Data Reviewed: I have personally reviewed following labs and imaging studies.  CBC: Recent Labs  Lab 07/23/18 1346  WBC 10.9*  NEUTROABS 7.8*  HGB 11.8*  HCT 36.1*  MCV 92.3  PLT 562   Basic Metabolic Panel: Recent Labs  Lab 07/23/18 1346  NA 135  K  4.4  CL 98  CO2 28  GLUCOSE 106*  BUN 15  CREATININE 0.81  CALCIUM 8.9  MG 2.2   GFR: CrCl cannot be calculated (Unknown ideal weight.). Liver Function Tests: Recent Labs  Lab 07/23/18 1346  AST 89*  ALT 126*  ALKPHOS 158*  BILITOT 0.7  PROT 5.9*  ALBUMIN 2.4*   No results for input(s): LIPASE, AMYLASE in the last 168 hours. No results for input(s): AMMONIA in the last 168 hours. Coagulation Profile: Recent Labs  Lab 07/23/18 0614  INR 1.2   Cardiac Enzymes: No results  for input(s): CKTOTAL, CKMB, CKMBINDEX, TROPONINI in the last 168 hours. BNP (last 3 results) No results for input(s): PROBNP in the last 8760 hours. HbA1C: No results for input(s): HGBA1C in the last 72 hours. CBG: Recent Labs  Lab 07/23/18 0631  GLUCAP 110*   Lipid Profile: No results for input(s): CHOL, HDL, LDLCALC, TRIG, CHOLHDL, LDLDIRECT in the last 72 hours. Thyroid Function Tests: No results for input(s): TSH, T4TOTAL, FREET4, T3FREE, THYROIDAB in the last 72 hours. Anemia Panel: No results for input(s): VITAMINB12, FOLATE, FERRITIN, TIBC, IRON, RETICCTPCT in the last 72 hours. Sepsis Labs: No results for input(s): PROCALCITON, LATICACIDVEN in the last 168 hours.  Recent Results (from the past 240 hour(s))  Respiratory Panel by PCR     Status: None   Collection Time: 07/23/18  6:41 AM  Result Value Ref Range Status   Adenovirus NOT DETECTED NOT DETECTED Final   Coronavirus 229E NOT DETECTED NOT DETECTED Final    Comment: (NOTE) The Coronavirus on the Respiratory Panel, DOES NOT test for the novel  Coronavirus (2019 nCoV)    Coronavirus HKU1 NOT DETECTED NOT DETECTED Final   Coronavirus NL63 NOT DETECTED NOT DETECTED Final   Coronavirus OC43 NOT DETECTED NOT DETECTED Final   Metapneumovirus NOT DETECTED NOT DETECTED Final   Rhinovirus / Enterovirus NOT DETECTED NOT DETECTED Final   Influenza A NOT DETECTED NOT DETECTED Final   Influenza B NOT DETECTED NOT DETECTED Final    Parainfluenza Virus 1 NOT DETECTED NOT DETECTED Final   Parainfluenza Virus 2 NOT DETECTED NOT DETECTED Final   Parainfluenza Virus 3 NOT DETECTED NOT DETECTED Final   Parainfluenza Virus 4 NOT DETECTED NOT DETECTED Final   Respiratory Syncytial Virus NOT DETECTED NOT DETECTED Final   Bordetella pertussis NOT DETECTED NOT DETECTED Final   Chlamydophila pneumoniae NOT DETECTED NOT DETECTED Final   Mycoplasma pneumoniae NOT DETECTED NOT DETECTED Final    Comment: Performed at [facility], [place]. [address], [place], [place] [address]  SARS Coronavirus 2 (CEPHEID - Performed in [place] hospital lab), Hosp Order     Status: None   Collection Time: 07/23/18  6:42 AM  Result Value Ref Range Status   SARS Coronavirus 2 NEGATIVE NEGATIVE Final    Comment: (NOTE) If result is NEGATIVE SARS-CoV-2 target nucleic acids are NOT DETECTED. The SARS-CoV-2 RNA is generally detectable in upper and lower  respiratory specimens during the acute phase of infection. The lowest  concentration of SARS-CoV-2 viral copies this assay can detect is 250  copies / mL. A negative result does not preclude SARS-CoV-2 infection  and should not be used as the sole basis for treatment or other  patient management decisions.  A negative result may occur with  improper specimen collection / handling, submission of specimen other  than nasopharyngeal swab, presence of viral mutation(s) within the  areas targeted by this assay, and inadequate number of viral copies  (<250 copies / mL). A negative result must be combined with clinical  observations, patient history, and epidemiological information. If result is POSITIVE SARS-CoV-2 target nucleic acids are DETECTED. The SARS-CoV-2 RNA is generally detectable in upper and lower  respiratory specimens dur ing the acute phase of infection.  Positive  results are indicative of active infection with SARS-CoV-2.  Clinical  correlation with patient history and other  diagnostic information is  necessary to determine patient infection status.  Positive results do  not rule out bacterial infection or co-infection with other viruses. If result  is PRESUMPTIVE POSTIVE SARS-CoV-2 nucleic acids MAY BE PRESENT.   A presumptive positive result was obtained on the submitted specimen  and confirmed on repeat testing.  While 2019 novel coronavirus  (SARS-CoV-2) nucleic acids may be present in the submitted sample  additional confirmatory testing may be necessary for epidemiological  and / or clinical management purposes  to differentiate between  SARS-CoV-2 and other Sarbecovirus currently known to infect humans.  If clinically indicated additional testing with an alternate test  methodology [phone]) is advised. The SARS-CoV-2 RNA is generally  detectable in upper and lower respiratory sp ecimens during the acute  phase of infection. The expected result is Negative. Fact Sheet for Patients:  StrictlyIdeas.no Fact Sheet for Healthcare Providers: BankingDealers.co.za This test is not yet approved or cleared by the [place] FDA and has been authorized for detection and/or diagnosis of SARS-CoV-2 by FDA under an Emergency Use Authorization (EUA).  This EUA will remain in effect (meaning this test can be used) for the duration of the COVID-19 declaration under Section 564(b)(1) of the Act, 21 U.S.C. section 360bbb-3(b)(1), unless the authorization is terminated or revoked sooner. Performed at [facility], [place] [address] [place], [place] [address]   MRSA PCR Screening     Status: None   Collection Time: 07/23/18  6:43 AM  Result Value Ref Range Status   MRSA by PCR NEGATIVE NEGATIVE Final    Comment:        The GeneXpert MRSA Assay (FDA approved for NASAL specimens only), is one component of a comprehensive MRSA colonization surveillance program. It is not intended to diagnose MRSA infection nor  to guide or monitor treatment for MRSA infections. Performed at [facility], [place] [address], [place], [place] [address]          Radiology Studies: No results found.      Scheduled Meds: . amLODipine  2.5 mg Oral Daily  . escitalopram  30 mg Oral Daily  . gabapentin  300 mg Oral QHS  . losartan  100 mg Oral Daily  . metoprolol tartrate  12.5 mg Oral BID  . pantoprazole  40 mg Oral Daily  . rosuvastatin  40 mg Oral Daily  . traMADol  50 mg Oral Q6H   Continuous Infusions:   LOS: 0 days    Time spent: 30 minutes spent in the coordination of care of this patient today.     Jonnie Finner, DO [facility] Pager [phone]  If 7PM-7AM, please contact night-coverage [email or web address] Password [email or web address] 07/23/2018, 7:00 PM

## 2018-07-23 NOTE — Consult Note (Addendum)
Reason for Consult:L rib FX S/P fall 1 week ago Referring Physician: X Dmonte Maher is an 68 y.o. male.  HPI: 68 year old gentleman with a history of mild dementia, hypertension, and coronary artery disease status post CABG in [place] 4 years ago was up on a ladder 1 week ago when he fell.  He was about 6 feet up.  He had pain in his left chest and was seen by a nurse practitioner initially.  He remained at home but has had increasing pain in his left chest with some shortness of breath.  He denies fevers.  He saw his nurse practitioner again and she referred him to the emergency department where CT scan of his chest revealed left rib fractures 4 through 9 with a small to moderate hemothorax.  It also showed diffuse bilateral infiltrates suspicious for viral pneumonia.  Saturations were 80% on room air.  He was transferred to the hospitalist service for admission at [facility].  Initial COVID test was negative but this is being repeated.  Past Medical History:  Diagnosis Date  . Coronary artery disease   . Depression   . GERD (gastroesophageal reflux disease)   . HLD (hyperlipidemia)   . HTN (hypertension)   . Hypertension     Past Surgical History:  Procedure Laterality Date  . CARDIAC SURGERY    . CORONARY ARTERY BYPASS GRAFT    . HERNIA REPAIR      No family history on file.  Social History:  reports that he has never smoked. He has never used smokeless tobacco. He reports previous alcohol use. He reports that he does not use drugs.  Allergies: No Known Allergies  Medications: I have reviewed the patient's current medications.  Results for orders placed or performed during the hospital encounter of 07/23/18 (from the past 48 hour(s))  Protime-INR     Status: None   Collection Time: 07/23/18  6:14 AM  Result Value Ref Range   Prothrombin Time 15.0 11.4 - 15.2 seconds   INR 1.2 0.8 - 1.2    Comment: (NOTE) INR goal varies based on device and disease states. Performed  at [facility], [place] [address] [place], [place] [address]   APTT     Status: None   Collection Time: 07/23/18  6:14 AM  Result Value Ref Range   aPTT 33 24 - 36 seconds    Comment: Performed at [place] [address] [place], [place] [address]  Type and screen [place]     Status: None (Preliminary result)   Collection Time: 07/23/18  6:14 AM  Result Value Ref Range   ABO/RH(D) PENDING    Antibody Screen PENDING    Sample Expiration      07/26/2018,2359 Performed at [facility], [place] [address], [place],  [address]   Glucose, capillary     Status: Abnormal   Collection Time: 07/23/18  6:31 AM  Result Value Ref Range   Glucose-Capillary 110 (H) 70 - 99 mg/dL  Influenza panel by PCR (type A & B)     Status: None   Collection Time: 07/23/18  6:41 AM  Result Value Ref Range   Influenza A By PCR NEGATIVE NEGATIVE   Influenza B By PCR NEGATIVE NEGATIVE    Comment: (NOTE) The Xpert Xpress Flu assay is intended as an aid in the diagnosis of  influenza and should not be used as a sole basis for treatment.  This  assay is FDA approved for nasopharyngeal swab specimens only. Nasal  washings and aspirates are unacceptable for Xpert Xpress Flu testing. Performed at [facility], [place] [address] [place], [place] [address]     No results found.  Review of Systems  Constitutional: Negative for chills and fever.  HENT: Positive for hearing loss.   Eyes: Negative.   Respiratory: Positive for shortness of breath. Negative for cough.   Cardiovascular: Positive for chest pain.  Gastrointestinal: Negative for abdominal pain, nausea and vomiting.  Genitourinary: Negative.   Musculoskeletal: Negative.   Skin: Negative.   Neurological:       No loss of consciousness when he fell 1 week ago  Endo/Heme/Allergies: Negative.   Psychiatric/Behavioral: Positive for memory loss.   Blood pressure 116/72, pulse 61, temperature 98.4 F  (36.9 C), temperature source Oral, resp. rate 14, SpO2 97 %. Physical Exam  Constitutional: He appears well-developed and well-nourished. No distress.  HENT:  Head: Normocephalic.  Right Ear: External ear normal.  Left Ear: External ear normal.  Mouth/Throat: Oropharynx is clear and moist.  Eyes: Pupils are equal, round, and reactive to light. No scleral icterus.  Neck: No tracheal deviation present. No thyromegaly present.  No posterior midline tenderness  Cardiovascular: Normal rate, regular rhythm and normal heart sounds.  Respiratory: Effort normal.  Breath sounds clear but somewhat decreased at left base  GI: Soft. He exhibits no distension. There is no abdominal tenderness. There is no rebound and no guarding.  Musculoskeletal:        General: No tenderness or edema.  Neurological: He is alert. He displays no atrophy and no tremor. He exhibits normal muscle tone. He displays no seizure activity. GCS eye subscore is 4. GCS verbal subscore is 5. GCS motor subscore is 6.  Skin: Skin is warm.  Psychiatric: He has a normal mood and affect.    Assessment/Plan: Status post fall 1 week ago with left ribs 4 through 9 and small to moderate hemothorax - at 1 week out, this hemothorax is very likely clotted and not amenable to simple drainage.  Recommend multimodal pain control (I added scheduled Ultram and PRN Robaxin), pulmonary toilet, incentive spirometry.  If this area worsens, he may need VATS by TCTS. CXR in AM.  Acute hypoxic respiratory failure - medical management, agree with repeat COVID test as he does have a diffuse viral pneumonia pattern on his chest CT.   Zenovia Jarred 07/23/2018, 7:34 AM

## 2018-07-24 ENCOUNTER — Inpatient Hospital Stay (HOSPITAL_COMMUNITY): Payer: PPO

## 2018-07-24 LAB — HEPATITIS PANEL, ACUTE
HCV Ab: <0.1 s/co ratio (ref 0.0–0.9)
Hep A IgM: NEGATIVE
Hep B C IgM: NEGATIVE
Hepatitis B Surface Ag: NEGATIVE

## 2018-07-24 LAB — CBC WITH DIFFERENTIAL/PLATELET
Abs Immature Granulocytes: 0.04 10*3/uL (ref 0.00–0.07)
Basophils Absolute: 0.1 10*3/uL (ref 0.0–0.1)
Basophils Relative: 1 %
Eosinophils Absolute: 0.8 10*3/uL — ABNORMAL HIGH (ref 0.0–0.5)
Eosinophils Relative: 10 %
HCT: 39.5 % (ref 39.0–52.0)
Hemoglobin: 13 g/dL (ref 13.0–17.0)
Immature Granulocytes: 1 %
Lymphocytes Relative: 12 %
Lymphs Abs: 0.9 10*3/uL (ref 0.7–4.0)
MCH: 30.2 pg (ref 26.0–34.0)
MCHC: 32.9 g/dL (ref 30.0–36.0)
MCV: 91.6 fL (ref 80.0–100.0)
Monocytes Absolute: 0.7 10*3/uL (ref 0.1–1.0)
Monocytes Relative: 8 %
Neutro Abs: 5.4 10*3/uL (ref 1.7–7.7)
Neutrophils Relative %: 68 %
Platelets: 400 10*3/uL (ref 150–400)
RBC: 4.31 MIL/uL (ref 4.22–5.81)
RDW: 12 % (ref 11.5–15.5)
WBC: 8 10*3/uL (ref 4.0–10.5)
nRBC: 0 % (ref 0.0–0.2)

## 2018-07-24 LAB — COMPREHENSIVE METABOLIC PANEL
ALT: 153 U/L — ABNORMAL HIGH (ref 0–44)
AST: 128 U/L — ABNORMAL HIGH (ref 15–41)
Albumin: 2.4 g/dL — ABNORMAL LOW (ref 3.5–5.0)
Alkaline Phosphatase: 168 U/L — ABNORMAL HIGH (ref 38–126)
Anion gap: 9 (ref 5–15)
BUN: 15 mg/dL (ref 8–23)
CO2: 27 mmol/L (ref 22–32)
Calcium: 9 mg/dL (ref 8.9–10.3)
Chloride: 99 mmol/L (ref 98–111)
Creatinine, Ser: 0.75 mg/dL (ref 0.61–1.24)
GFR calc Af Amer: >60 mL/min (ref >60)
GFR calc non Af Amer: >60 mL/min (ref >60)
Glucose, Bld: 100 mg/dL — ABNORMAL HIGH (ref 70–99)
Potassium: 4.6 mmol/L (ref 3.5–5.1)
Sodium: 135 mmol/L (ref 135–145)
Total Bilirubin: 0.6 mg/dL (ref 0.3–1.2)
Total Protein: 6 g/dL — ABNORMAL LOW (ref 6.5–8.1)

## 2018-07-24 NOTE — Plan of Care (Signed)
  Problem: Education: Goal: Knowledge of General Education information will improve Description Including pain rating scale, medication(s)/side effects and non-pharmacologic comfort measures Outcome: Progressing   Problem: Health Behavior/Discharge Planning: Goal: Ability to manage health-related needs will improve Outcome: Progressing   Problem: Pain Managment: Goal: General experience of comfort will improve Outcome: Progressing   Problem: Activity: Goal: Risk for activity intolerance will decrease Outcome: Not Progressing

## 2018-07-24 NOTE — Progress Notes (Signed)
Spoke with pts wife on phone. Pts wife concerned pt will d/c today and not have a heads up. Informed wife we will let her know of a plan when we have one. Will continue to follow.  Rufina Falco, RN BSN 07/24/2018 7:17 AM

## 2018-07-24 NOTE — Progress Notes (Signed)
Carlos Rowland Kitchen  PROGRESS NOTE    Carlos Rowland  WUJ:[id number] DOB: May 31, 1950 DOA: 07/23/2018 PCP: Carlos Rowland., MD   Brief Narrative:   Carlos Senner Dugginsis a 68 y.o.malewith medical history significant ofhypertension, hyperlipidemia, GERD, CAD, mild dementia, who presents with altered mental status.  [facility]  Patient had fall 1 week agowhen was working on [employer] ladder,caused left rib fracture (fourth-ninth), atinyleft pneumothoraxandsmall tomoderate hemothorax per EDP. No LOC. Pt was noted to be more confused than his baseline. He was seen [facility] hospital initially.  Patient was found to have oxygen desaturation to 80% on room air,which improved to 95% on 2 L oxygen. CTA of chest negative for PE, but showedbilateral atypical infiltration with groundglass pattern, suspectingviral infection, such as COVID-19infection,but COVID19test is negative.Also showed rib fracture (4th to 9th).Dr.Osborn discussed case withon-call MD in [place] who recommended to keep pt there and repeat the COVID19 test, but they do not haveappropriatePPE, and also pt has othercondition and thereforeneedsto be transferred. Trauma surgeon, Dr. Brantley Stage was consulted, who agreed to consult on this case.  Patient was found to have negative urinalysis, WBC 8.8, hemoglobin 11.9 (14.8 on 09/04/2017),lactic acid of 0.9, negative troponin, proBNP 2 4, abnormal liver function (ALP 180, AST 2 8, ALT 212, total bilirubin 0.6), CRP 247.5, LDH 933, ferritin>1000. Temperature normal, blood pressure 137/65, heart rate 61, RR 20, oxygen saturation 95% on 2 L oxygen. CT head is negative for acute intracranial abnormalities. CT of C-spine was not done.  At arrival to the floor,his mental status improved. He is alert, oriented x3. He moves all extremities normally. Patient reports severe left-sided chest pain, which is constant, sharp, 10 out of 10 in severity. Patient denies  shortness of breath. He has a cough with a little yellow-colored mucus production. No fever or chills. Denies nausea vomiting, diarrhea, abdominal pain, symptoms of UTI. Oxygen saturation 97% on room air, temperature normal, heart rate 61, respiration rate 14, blood pressure 116/72. Patient is admitted to stepdown    Assessment & Plan:   Principal Problem:   Hemothorax Active Problems:   Coronary artery disease   Depression   GERD (gastroesophageal reflux disease)   HLD (hyperlipidemia)   HTN (hypertension)   Hypertension   Fall   Pneumothorax   Acute metabolic encephalopathy   Abnormal liver function   Atypical pneumonia   Left rib fracture   Hemothorax and pneumothorax and rib fracture:      - pt has Small to moderate amount of hemothorax and a tiny amount of pneumothorax.      - Pt had oxygen desaturation earlier, but currently his saturation is 97% on room air.       - Patient complains of severe left-sided chest wall pain.  Trauma surgeon, Dr. Brantley Stage was consulted by EDP, nurse will inform Dr. Brantley Stage of patient's arrival.     - Pain control: PRN morphine and oxycodone     - Incentive spirometry     - f/u surgeon's recommendation which would be highly appreciated.  Coronary artery disease: s/p of CABG. Trop negative.     - continue metoprolol and Crestor     - Hold aspirin due to pneumothorax  Possible atypical pneumonia:      - Patient states that he has some mild cough with little yellow-colored mucus production.       - CT angiogram showed atypical ground glass pattern for infiltration.       - COVID 19 negative x 2      -  Continue to hold abx for now    - viral resp panel negative, flu negative     - As needed albuterol inhaler and Mucinex for cough     - Oxygenation improving, he looks better; continue as above.   GERD:     -Protonix  HLD (hyperlipidemia):      -crestor  HTN:      - Continue home medications: Losartan, metoprolol, amlodipine      - IV hydralazine prn  Depression:     - Lexapro  Fall:      - Seems to be an accident (bent ladder came out from underneath him).       - CT head is negative for acute intracranial abnormalities.     - Patient denies neck pain.  Acute metabolic encephalopathy:      - seems have resolved.       - Currently patient is alert, oriented x3.  No focal neurologic findings on physical examination.     - resoolved  Abnormal liver function:      - hepatitis panel and HIV antibody negative     - Avoid using liver toxic medications such as Tylenol   DVT prophylaxis: SCDs Code Status: FULL   Disposition Plan: TBD   Consultants:   Trauma   Subjective: "I wish I could do that this at home."  Objective: Vitals:   07/24/18 0500 07/24/18 0700 07/24/18 0801 07/24/18 0859  BP:   (!) 117/92   Pulse:  60 69   Resp: (!) 26  20   Temp:   98.6 F (37 C)   TempSrc:   Oral   SpO2:  95% 96% 98%    Intake/Output Summary (Last 24 hours) at 07/24/2018 1410 Last data filed at 07/24/2018 0902 Gross per 24 hour  Intake -  Output 1250 ml  Net -1250 ml   There were no vitals filed for this visit.  Examination:  General: 68 y.o. male resting in chair in NAD Cardiovascular: RRR, +S1, S2, no m/g/r, equal pulses throughout Respiratory: clear anteriorly, decreased on left base GI: BS+, NDNT, no masses noted, no organomegaly noted MSK: No e/c/c Skin: No rashes, bruises, ulcerations noted Neuro: A&O x 3, no focal deficits   Data Reviewed: I have personally reviewed following labs and imaging studies.  CBC: Recent Labs  Lab 07/23/18 1346 07/24/18 1053  WBC 10.9* 8.0  NEUTROABS 7.8* 5.4  HGB 11.8* 13.0  HCT 36.1* 39.5  MCV 92.3 91.6  PLT 367 401   Basic Metabolic Panel: Recent Labs  Lab 07/23/18 1346 07/24/18 0303  NA 135 135  K 4.4 4.6  CL 98 99  CO2 28 27  GLUCOSE 106* 100*  BUN 15 15  CREATININE 0.81 0.75  CALCIUM 8.9 9.0  MG 2.2  --    GFR: CrCl cannot be  calculated (Unknown ideal weight.). Liver Function Tests: Recent Labs  Lab 07/23/18 1346 07/24/18 0303  AST 89* 128*  ALT 126* 153*  ALKPHOS 158* 168*  BILITOT 0.7 0.6  PROT 5.9* 6.0*  ALBUMIN 2.4* 2.4*   No results for input(s): LIPASE, AMYLASE in the last 168 hours. No results for input(s): AMMONIA in the last 168 hours. Coagulation Profile: Recent Labs  Lab 07/23/18 0614  INR 1.2   Cardiac Enzymes: No results for input(s): CKTOTAL, CKMB, CKMBINDEX, TROPONINI in the last 168 hours. BNP (last 3 results) No results for input(s): PROBNP in the last 8760 hours. HbA1C: No results for input(s): HGBA1C in the  last 72 hours. CBG: Recent Labs  Lab 07/23/18 0631  GLUCAP 110*   Lipid Profile: No results for input(s): CHOL, HDL, LDLCALC, TRIG, CHOLHDL, LDLDIRECT in the last 72 hours. Thyroid Function Tests: No results for input(s): TSH, T4TOTAL, FREET4, T3FREE, THYROIDAB in the last 72 hours. Anemia Panel: No results for input(s): VITAMINB12, FOLATE, FERRITIN, TIBC, IRON, RETICCTPCT in the last 72 hours. Sepsis Labs: No results for input(s): PROCALCITON, LATICACIDVEN in the last 168 hours.  Recent Results (from the past 240 hour(s))  Respiratory Panel by PCR     Status: None   Collection Time: 07/23/18  6:41 AM  Result Value Ref Range Status   Adenovirus NOT DETECTED NOT DETECTED Final   Coronavirus 229E NOT DETECTED NOT DETECTED Final    Comment: (NOTE) The Coronavirus on the Respiratory Panel, DOES NOT test for the novel  Coronavirus (2019 nCoV)    Coronavirus HKU1 NOT DETECTED NOT DETECTED Final   Coronavirus NL63 NOT DETECTED NOT DETECTED Final   Coronavirus OC43 NOT DETECTED NOT DETECTED Final   Metapneumovirus NOT DETECTED NOT DETECTED Final   Rhinovirus / Enterovirus NOT DETECTED NOT DETECTED Final   Influenza A NOT DETECTED NOT DETECTED Final   Influenza B NOT DETECTED NOT DETECTED Final   Parainfluenza Virus 1 NOT DETECTED NOT DETECTED Final   Parainfluenza  Virus 2 NOT DETECTED NOT DETECTED Final   Parainfluenza Virus 3 NOT DETECTED NOT DETECTED Final   Parainfluenza Virus 4 NOT DETECTED NOT DETECTED Final   Respiratory Syncytial Virus NOT DETECTED NOT DETECTED Final   Bordetella pertussis NOT DETECTED NOT DETECTED Final   Chlamydophila pneumoniae NOT DETECTED NOT DETECTED Final   Mycoplasma pneumoniae NOT DETECTED NOT DETECTED Final    Comment: Performed at [facility], [place]. [address] [place], [place] [address]  SARS Coronavirus 2 (CEPHEID - Performed in [place] hospital lab), Hosp Order     Status: None   Collection Time: 07/23/18  6:42 AM  Result Value Ref Range Status   SARS Coronavirus 2 NEGATIVE NEGATIVE Final    Comment: (NOTE) If result is NEGATIVE SARS-CoV-2 target nucleic acids are NOT DETECTED. The SARS-CoV-2 RNA is generally detectable in upper and lower  respiratory specimens during the acute phase of infection. The lowest  concentration of SARS-CoV-2 viral copies this assay can detect is 250  copies / mL. A negative result does not preclude SARS-CoV-2 infection  and should not be used as the sole basis for treatment or other  patient management decisions.  A negative result may occur with  improper specimen collection / handling, submission of specimen other  than nasopharyngeal swab, presence of viral mutation(s) within the  areas targeted by this assay, and inadequate number of viral copies  (<250 copies / mL). A negative result must be combined with clinical  observations, patient history, and epidemiological information. If result is POSITIVE SARS-CoV-2 target nucleic acids are DETECTED. The SARS-CoV-2 RNA is generally detectable in upper and lower  respiratory specimens dur ing the acute phase of infection.  Positive  results are indicative of active infection with SARS-CoV-2.  Clinical  correlation with patient history and other diagnostic information is  necessary to determine patient infection status.   Positive results do  not rule out bacterial infection or co-infection with other viruses. If result is PRESUMPTIVE POSTIVE SARS-CoV-2 nucleic acids MAY BE PRESENT.   A presumptive positive result was obtained on the submitted specimen  and confirmed on repeat testing.  While 2019 novel coronavirus  (  SARS-CoV-2) nucleic acids may be present in the submitted sample  additional confirmatory testing may be necessary for epidemiological  and / or clinical management purposes  to differentiate between  SARS-CoV-2 and other Sarbecovirus currently known to infect humans.  If clinically indicated additional testing with an alternate test  methodology [phone]) is advised. The SARS-CoV-2 RNA is generally  detectable in upper and lower respiratory sp ecimens during the acute  phase of infection. The expected result is Negative. Fact Sheet for Patients:  StrictlyIdeas.no Fact Sheet for Healthcare Providers: BankingDealers.co.za This test is not yet approved or cleared by the [place] FDA and has been authorized for detection and/or diagnosis of SARS-CoV-2 by FDA under an Emergency Use Authorization (EUA).  This EUA will remain in effect (meaning this test can be used) for the duration of the COVID-19 declaration under Section 564(b)(1) of the Act, 21 U.S.C. section 360bbb-3(b)(1), unless the authorization is terminated or revoked sooner. Performed at [facility], [place] [address], [place], [place] [address]   MRSA PCR Screening     Status: None   Collection Time: 07/23/18  6:43 AM  Result Value Ref Range Status   MRSA by PCR NEGATIVE NEGATIVE Final    Comment:        The GeneXpert MRSA Assay (FDA approved for NASAL specimens only), is one component of a comprehensive MRSA colonization surveillance program. It is not intended to diagnose MRSA infection nor to guide or monitor treatment for MRSA infections. Performed at [facility], [place] [address], [place], [place] [address]          Radiology Studies: Dg Chest Port 1 View  Result Date: 07/24/2018 CLINICAL DATA:  Respiratory failure EXAM: PORTABLE CHEST 1 VIEW COMPARISON:  CTA chest dated 07/22/2018 FINDINGS: Multifocal patchy opacities in the lungs bilaterally, suggesting asymmetric interstitial edema versus multifocal pneumonia. Small left pleural effusion. No pneumothorax. Cardiomegaly.  Postsurgical changes related to prior CABG. Median sternotomy. IMPRESSION: Multifocal patchy airspace opacities, suggesting asymmetric interstitial edema versus multifocal pneumonia. Small left pleural effusion. Electronically Signed   By: Julian Hy M.D.   On: 07/24/2018 10:29        Scheduled Meds: . amLODipine  2.5 mg Oral Daily  . escitalopram  30 mg Oral Daily  . gabapentin  300 mg Oral QHS  . losartan  100 mg Oral Daily  . metoprolol tartrate  12.5 mg Oral BID  . pantoprazole  40 mg Oral Daily  . rosuvastatin  40 mg Oral Daily  . traMADol  50 mg Oral Q6H   Continuous Infusions:   LOS: 1 day    Time spent: 25 minutes spent in the coordination of care today.     Jonnie Finner, DO [facility] Pager [phone]  If 7PM-7AM, please contact night-coverage [email or web address] Password [email or web address] 07/24/2018, 2:10 PM

## 2018-07-24 NOTE — Progress Notes (Signed)
Patient ID: Carlos Rowland, male   DOB: 06-17-1950, 68 y.o.   MRN: [id number]       Subjective: Patient feels fine this morning.  Annoyed by all the lines and hook ups he has on him.  Ate breakfast well.  Has O2 off and doesn't feel SOB.  Objective: Vital signs in last 24 hours: Temp:  [98.4 F (36.9 C)-98.8 F (37.1 C)] 98.6 F (37 C) (05/16 0801) Pulse Rate:  [58-69] 69 (05/16 0801) Resp:  [12-26] 20 (05/16 0801) BP: (102-128)/(59-92) 117/92 (05/16 0801) SpO2:  [87 %-100 %] 96 % (05/16 0801) Last BM Date: 07/21/18  Intake/Output from previous day: 05/15 0701 - 05/16 0700 In: -  Out: 1200 [Urine:1200] Intake/Output this shift: Total I/O In: -  Out: 300 [Urine:300]  PE: Gen: NAD Heart: Regular Lungs: CTAB, some decrease sounds at the bases, but overall good effort and no distress.  O2 sats on RA between 90-96%. Abd: soft, NT, ND, +BS  Lab Results:  Recent Labs    07/23/18 1346  WBC 10.9*  HGB 11.8*  HCT 36.1*  PLT 367   BMET Recent Labs    07/23/18 1346 07/24/18 0303  NA 135 135  K 4.4 4.6  CL 98 99  CO2 28 27  GLUCOSE 106* 100*  BUN 15 15  CREATININE 0.81 0.75  CALCIUM 8.9 9.0   PT/INR Recent Labs    07/23/18 0614  LABPROT 15.0  INR 1.2   CMP     Component Value Date/Time   NA 135 07/24/2018 0303   K 4.6 07/24/2018 0303   CL 99 07/24/2018 0303   CO2 27 07/24/2018 0303   GLUCOSE 100 (H) 07/24/2018 0303   BUN 15 07/24/2018 0303   CREATININE 0.75 07/24/2018 0303   CALCIUM 9.0 07/24/2018 0303   PROT 6.0 (L) 07/24/2018 0303   ALBUMIN 2.4 (L) 07/24/2018 0303   AST 128 (H) 07/24/2018 0303   ALT 153 (H) 07/24/2018 0303   ALKPHOS 168 (H) 07/24/2018 0303   BILITOT 0.6 07/24/2018 0303   GFRNONAA >60 07/24/2018 0303   GFRAA >60 07/24/2018 0303   Lipase  No results found for: LIPASE     Studies/Results: No results found.  Anti-infectives: Anti-infectives (From admission, onward)   None       Assessment/Plan Fall 1 week ago Left  4-9 rib FX with hemothorax - CXR appears relatively stable, but not read yet.  O2 sats improved from admission as of right now.  Cont pulm toilet, IS, and pain control.  Area does not appear to have worsened, but if so TCTS may need to be called. Acute hypoxic respiratory failure - per medicine FEN - regular diet VTE - per medicine, SCDs ID - none   LOS: 1 day    Henreitta Cea , [facility] 07/24/2018, 8:55 AM Pager: [phone]

## 2018-07-24 NOTE — Progress Notes (Signed)
Updated pts wife on pts condition via telephone. Informed her pt is staying today. Pts wife encouraged to call back with any further questions.   Rufina Falco, RN BSN 07/24/2018 10:48 AM

## 2018-07-25 LAB — RENAL FUNCTION PANEL
Albumin: 2.4 g/dL — ABNORMAL LOW (ref 3.5–5.0)
Anion gap: 12 (ref 5–15)
BUN: 14 mg/dL (ref 8–23)
CO2: 26 mmol/L (ref 22–32)
Calcium: 8.8 mg/dL — ABNORMAL LOW (ref 8.9–10.3)
Chloride: 97 mmol/L — ABNORMAL LOW (ref 98–111)
Creatinine, Ser: 0.75 mg/dL (ref 0.61–1.24)
GFR calc Af Amer: >60 mL/min (ref >60)
GFR calc non Af Amer: >60 mL/min (ref >60)
Glucose, Bld: 109 mg/dL — ABNORMAL HIGH (ref 70–99)
Phosphorus: 4.2 mg/dL (ref 2.5–4.6)
Potassium: 4.2 mmol/L (ref 3.5–5.1)
Sodium: 135 mmol/L (ref 135–145)

## 2018-07-25 LAB — CBC WITH DIFFERENTIAL/PLATELET
Abs Immature Granulocytes: 0.09 10*3/uL — ABNORMAL HIGH (ref 0.00–0.07)
Basophils Absolute: 0.1 10*3/uL (ref 0.0–0.1)
Basophils Relative: 1 %
Eosinophils Absolute: 1.1 10*3/uL — ABNORMAL HIGH (ref 0.0–0.5)
Eosinophils Relative: 12 %
HCT: 35.7 % — ABNORMAL LOW (ref 39.0–52.0)
Hemoglobin: 12 g/dL — ABNORMAL LOW (ref 13.0–17.0)
Immature Granulocytes: 1 %
Lymphocytes Relative: 16 %
Lymphs Abs: 1.5 10*3/uL (ref 0.7–4.0)
MCH: 30.5 pg (ref 26.0–34.0)
MCHC: 33.6 g/dL (ref 30.0–36.0)
MCV: 90.8 fL (ref 80.0–100.0)
Monocytes Absolute: 0.8 10*3/uL (ref 0.1–1.0)
Monocytes Relative: 9 %
Neutro Abs: 5.7 10*3/uL (ref 1.7–7.7)
Neutrophils Relative %: 61 %
Platelets: 417 10*3/uL — ABNORMAL HIGH (ref 150–400)
RBC: 3.93 MIL/uL — ABNORMAL LOW (ref 4.22–5.81)
RDW: 12.1 % (ref 11.5–15.5)
WBC: 9.3 10*3/uL (ref 4.0–10.5)
nRBC: 0 % (ref 0.0–0.2)

## 2018-07-25 MED ORDER — SODIUM CHLORIDE 0.9 % IV SOLN
100.0000 mg | Freq: Two times a day (BID) | INTRAVENOUS | Status: DC
  Administered 2018-07-25 (×2): 100 mg via INTRAVENOUS
  Filled 2018-07-25 (×7): qty 100

## 2018-07-25 NOTE — Plan of Care (Signed)

## 2018-07-25 NOTE — Progress Notes (Signed)
Patient ambulated in hallway 250 feet on room air. Sats remained 90-97 percent. Sats dipped briefly to 86. as walk was ending, when seated sats at 90 percent.Only complaints was patient stated he was stiff. Will monitor patient. Carlos Rowland, Bettina Gavia rN

## 2018-07-25 NOTE — Progress Notes (Signed)
Patient ID: Carlos Rowland, male   DOB: 12/22/1950, 68 y.o.   MRN: [id number]       Subjective: Patient not on O2 this morning.  Feels about the same.  Doesn't talk much.  Having some pain from ribs, but seems well controlled.  Objective: Vital signs in last 24 hours: Temp:  [98 F (36.7 C)-98.3 F (36.8 C)] 98 F (36.7 C) (05/17 0743) Pulse Rate:  [56-66] 66 (05/17 0821) Resp:  [15-20] 15 (05/17 0743) BP: (91-121)/(58-91) 121/91 (05/17 0743) SpO2:  [90 %-100 %] 90 % (05/17 0743) Last BM Date: 07/21/18(per patient report)  Intake/Output from previous day: 05/16 0701 - 05/17 0700 In: 840 [P.O.:840] Out: 1500 [Urine:1500] Intake/Output this shift: No intake/output data recorded.  PE: Gen: sitting up in a chair in NAD Heart: regular Lungs: overall CTAB, some decrease sounds more on left than right.  Not on O2 currently.  90% documented this morning on vitals Abd: soft, NT, ND  Lab Results:  Recent Labs    07/24/18 1053 07/25/18 0432  WBC 8.0 9.3  HGB 13.0 12.0*  HCT 39.5 35.7*  PLT 400 417*   BMET Recent Labs    07/24/18 0303 07/25/18 0432  NA 135 135  K 4.6 4.2  CL 99 97*  CO2 27 26  GLUCOSE 100* 109*  BUN 15 14  CREATININE 0.75 0.75  CALCIUM 9.0 8.8*   PT/INR Recent Labs    07/23/18 0614  LABPROT 15.0  INR 1.2   CMP     Component Value Date/Time   NA 135 07/25/2018 0432   K 4.2 07/25/2018 0432   CL 97 (L) 07/25/2018 0432   CO2 26 07/25/2018 0432   GLUCOSE 109 (H) 07/25/2018 0432   BUN 14 07/25/2018 0432   CREATININE 0.75 07/25/2018 0432   CALCIUM 8.8 (L) 07/25/2018 0432   PROT 6.0 (L) 07/24/2018 0303   ALBUMIN 2.4 (L) 07/25/2018 0432   AST 128 (H) 07/24/2018 0303   ALT 153 (H) 07/24/2018 0303   ALKPHOS 168 (H) 07/24/2018 0303   BILITOT 0.6 07/24/2018 0303   GFRNONAA >60 07/25/2018 0432   GFRAA >60 07/25/2018 0432   Lipase  No results found for: LIPASE     Studies/Results: Dg Chest Port 1 View  Result Date: 07/24/2018 CLINICAL  DATA:  Respiratory failure EXAM: PORTABLE CHEST 1 VIEW COMPARISON:  CTA chest dated 07/22/2018 FINDINGS: Multifocal patchy opacities in the lungs bilaterally, suggesting asymmetric interstitial edema versus multifocal pneumonia. Small left pleural effusion. No pneumothorax. Cardiomegaly.  Postsurgical changes related to prior CABG. Median sternotomy. IMPRESSION: Multifocal patchy airspace opacities, suggesting asymmetric interstitial edema versus multifocal pneumonia. Small left pleural effusion. Electronically Signed   By: Julian Hy M.D.   On: 07/24/2018 10:29    Anti-infectives: Anti-infectives (From admission, onward)   None       Assessment/Plan Fall 1 week ago Left 4-9 rib FX with hemothorax - CXR appears stable. Area does not appear to have worsened, but if so TCTS may need to be called.  Cont pulm toilet and pain control Acute hypoxic respiratory failure - per medicine; Check O2 sats with mobilization to determine what his O2 level does with mobilization. FEN - regular diet VTE - per medicine, SCDs ID - none Dispo - stable from trauma standpoint in regards to his rib fractures.  CXR shows small left pleural effusion.  Unclear if desats at this point are secondary to multi-focal PNA vs this small hemothorax.  Suspect the diffuse airspace disease form  his PNA.   LOS: 2 days    Henreitta Cea , [facility] Surgery 07/25/2018, 9:08 AM Pager: [phone]

## 2018-07-25 NOTE — Progress Notes (Signed)
[name] Kitchen  PROGRESS NOTE    KENDELL SAGRAVES  HUT:[id number] DOB: Sep 10, 1950 DOA: 07/23/2018 PCP: Raina Mina., MD   Brief Narrative:   Vuk Skillern Dugginsis a 68 y.o.malewith medical history significant ofhypertension, hyperlipidemia, GERD, CAD, mild dementia, who presents with altered mental status.  [facility]  Patient had fall 1 week agowhen was working on [employer] ladder,caused left rib fracture (fourth-ninth), atinyleft pneumothoraxandsmall tomoderate hemothorax per EDP. No LOC. Pt was noted to be more confused than his baseline. He was seen [facility] initially.  Patient was found to have oxygen desaturation to 80% on room air,which improved to 95% on 2 L oxygen. CTA of chest negative for PE, but showedbilateral atypical infiltration with groundglass pattern, suspectingviral infection, such as COVID-19infection,but COVID19test is negative.Also showed rib fracture (4th to 9th).Dr.Osborn discussed case withon-call [place] in [place] who recommended to keep pt there and repeat the COVID19 test, but they do not haveappropriatePPE, and also pt has othercondition and thereforeneedsto be transferred. Trauma surgeon, Dr. Brantley Stage was consulted, who agreed to consult on this case.  Patient was found to have negative urinalysis, WBC 8.8, hemoglobin 11.9 (14.8 on 09/04/2017),lactic acid of 0.9, negative troponin, proBNP 2 4, abnormal liver function (ALP 180, AST 2 8, ALT 212, total bilirubin 0.6), CRP 247.5, LDH 933, ferritin>1000. Temperature normal, blood pressure 137/65, heart rate 61, RR 20, oxygen saturation 95% on 2 L oxygen. CT head is negative for acute intracranial abnormalities. CT of C-spine was not done.  At arrival to the floor,his mental status improved. He is alert, oriented x3. He moves all extremities normally. Patient reports severe left-sided chest pain, which is constant, sharp, 10 out of 10 in severity. Patient denies  shortness of breath. He has a cough with a little yellow-colored mucus production. No fever or chills. Denies nausea vomiting, diarrhea, abdominal pain, symptoms of UTI. Oxygen saturation 97% on room air, temperature normal, heart rate 61, respiration rate 14, blood pressure 116/72. Patient is admitted to stepdown   Assessment & Plan:   Principal Problem:   Hemothorax Active Problems:   Coronary artery disease   Depression   GERD (gastroesophageal reflux disease)   HLD (hyperlipidemia)   HTN (hypertension)   Hypertension   Fall   Pneumothorax   Acute metabolic encephalopathy   Abnormal liver function   Atypical pneumonia   Left rib fracture   Hemothorax and pneumothorax and rib fracture:  - pt has Small to moderate amount of hemothorax and a tiny amount of pneumothorax.  - Pt had oxygen desaturation earlier, but currently his saturation is 97% on room air.  - Patient complains of severe left-sided chest wall pain. Trauma surgeon, Dr. Brantley Stage was consulted by EDP, nurse will inform Dr. Brantley Stage of patient's arrival. - Pain control: PRN morphine and oxycodone - Incentive spirometry - f/u surgeon's recommendation which would be highly appreciated.  Coronary artery disease: s/p of CABG. Trop negative. - continue metoprolol and Crestor - Hold aspirin due to pneumothorax  Possible atypical pneumonia:  - Patient states that he has some mild cough with little yellow-colored mucus production.  - CT angiogram showed atypical ground glass pattern for infiltration.  - COVID 19 negative x 2   - viral resp panel negative, flu negative - As needed albuterol inhaler and Mucinex for cough     - On RA, ambulating well     - doxy  GERD: -Protonix  HLD (hyperlipidemia):  -crestor  HTN:  - Continue home medications: Losartan, metoprolol, amlodipine -  IV hydralazine prn  Depression: - Lexapro   Fall:  - Seems to be an accident (bent ladder came out from underneath him).  - CT head is negative for acute intracranial abnormalities. - Patient denies neck pain.  Acute metabolic encephalopathy:  - seems have resolved.  - Currently patient is alert, oriented x3. No focal neurologic findings on physical examination. - resoolved  Abnormal liver function:  - hepatitis panel and HIV antibody negative - Avoid using liver toxic medications such as Tylenol  Home soon? Tomorrow?  DVT prophylaxis: SCDs Code Status: FULL Family Communication: Pt on phone with wife during interview   Disposition Plan: To home   Consultants:   Trauma  Antimicrobials:  . Doxy 100mg  BID   Subjective: "They tell me I have to be here one more day."  Objective: Vitals:   07/25/18 0026 07/25/18 0555 07/25/18 0743 07/25/18 0821  BP: 115/63 116/65 (!) 121/91   Pulse: 60 62 61 66  Resp: 16 15 15    Temp: 98.1 F (36.7 C) 98 F (36.7 C) 98 F (36.7 C)   TempSrc: Oral Oral Oral   SpO2: 92% 95% 90%     Intake/Output Summary (Last 24 hours) at 07/25/2018 1331 Last data filed at 07/25/2018 1100 Gross per 24 hour  Intake 240 ml  Output 1200 ml  Net -960 ml   There were no vitals filed for this visit.  Examination:  General:67 y.o.maleresting in chair in NAD Cardiovascular: RRR, +S1, S2, no m/g/r, equal pulses throughout Respiratory:clear anteriorly, decreased on left base GI: BS+, NDNT, no masses noted, no organomegaly noted MSK: No e/c/c Skin: No rashes, bruises, ulcerations noted Neuro: A&O x 3, no focal deficits    Data Reviewed: I have personally reviewed following labs and imaging studies.  CBC: Recent Labs  Lab 07/23/18 1346 07/24/18 1053 07/25/18 0432  WBC 10.9* 8.0 9.3  NEUTROABS 7.8* 5.4 5.7  HGB 11.8* 13.0 12.0*  HCT 36.1* 39.5 35.7*  MCV 92.3 91.6 90.8  PLT 367 400 295*   Basic Metabolic Panel: Recent Labs  Lab 07/23/18  1346 07/24/18 0303 07/25/18 0432  NA 135 135 135  K 4.4 4.6 4.2  CL 98 99 97*  CO2 28 27 26   GLUCOSE 106* 100* 109*  BUN 15 15 14   CREATININE 0.81 0.75 0.75  CALCIUM 8.9 9.0 8.8*  MG 2.2  --   --   PHOS  --   --  4.2   GFR: CrCl cannot be calculated (Unknown ideal weight.). Liver Function Tests: Recent Labs  Lab 07/23/18 1346 07/24/18 0303 07/25/18 0432  AST 89* 128*  --   ALT 126* 153*  --   ALKPHOS 158* 168*  --   BILITOT 0.7 0.6  --   PROT 5.9* 6.0*  --   ALBUMIN 2.4* 2.4* 2.4*   No results for input(s): LIPASE, AMYLASE in the last 168 hours. No results for input(s): AMMONIA in the last 168 hours. Coagulation Profile: Recent Labs  Lab 07/23/18 0614  INR 1.2   Cardiac Enzymes: No results for input(s): CKTOTAL, CKMB, CKMBINDEX, TROPONINI in the last 168 hours. BNP (last 3 results) No results for input(s): PROBNP in the last 8760 hours. HbA1C: No results for input(s): HGBA1C in the last 72 hours. CBG: Recent Labs  Lab 07/23/18 0631  GLUCAP 110*   Lipid Profile: No results for input(s): CHOL, HDL, LDLCALC, TRIG, CHOLHDL, LDLDIRECT in the last 72 hours. Thyroid Function Tests: No results for input(s): TSH, T4TOTAL, FREET4, T3FREE, THYROIDAB  in the last 72 hours. Anemia Panel: No results for input(s): VITAMINB12, FOLATE, FERRITIN, TIBC, IRON, RETICCTPCT in the last 72 hours. Sepsis Labs: No results for input(s): PROCALCITON, LATICACIDVEN in the last 168 hours.  Recent Results (from the past 240 hour(s))  Respiratory Panel by PCR     Status: None   Collection Time: 07/23/18  6:41 AM  Result Value Ref Range Status   Adenovirus NOT DETECTED NOT DETECTED Final   Coronavirus 229E NOT DETECTED NOT DETECTED Final    Comment: (NOTE) The Coronavirus on the Respiratory Panel, DOES NOT test for the novel  Coronavirus (2019 nCoV)    Coronavirus HKU1 NOT DETECTED NOT DETECTED Final   Coronavirus NL63 NOT DETECTED NOT DETECTED Final   Coronavirus OC43 NOT  DETECTED NOT DETECTED Final   Metapneumovirus NOT DETECTED NOT DETECTED Final   Rhinovirus / Enterovirus NOT DETECTED NOT DETECTED Final   Influenza A NOT DETECTED NOT DETECTED Final   Influenza B NOT DETECTED NOT DETECTED Final   Parainfluenza Virus 1 NOT DETECTED NOT DETECTED Final   Parainfluenza Virus 2 NOT DETECTED NOT DETECTED Final   Parainfluenza Virus 3 NOT DETECTED NOT DETECTED Final   Parainfluenza Virus 4 NOT DETECTED NOT DETECTED Final   Respiratory Syncytial Virus NOT DETECTED NOT DETECTED Final   Bordetella pertussis NOT DETECTED NOT DETECTED Final   Chlamydophila pneumoniae NOT DETECTED NOT DETECTED Final   Mycoplasma pneumoniae NOT DETECTED NOT DETECTED Final    Comment: Performed at [facility], [place]. [address] [place], [place] [address]  SARS Coronavirus 2 (CEPHEID - Performed in [place] hospital lab), Hosp Order     Status: None   Collection Time: 07/23/18  6:42 AM  Result Value Ref Range Status   SARS Coronavirus 2 NEGATIVE NEGATIVE Final    Comment: (NOTE) If result is NEGATIVE SARS-CoV-2 target nucleic acids are NOT DETECTED. The SARS-CoV-2 RNA is generally detectable in upper and lower  respiratory specimens during the acute phase of infection. The lowest  concentration of SARS-CoV-2 viral copies this assay can detect is 250  copies / mL. A negative result does not preclude SARS-CoV-2 infection  and should not be used as the sole basis for treatment or other  patient management decisions.  A negative result may occur with  improper specimen collection / handling, submission of specimen other  than nasopharyngeal swab, presence of viral mutation(s) within the  areas targeted by this assay, and inadequate number of viral copies  (<250 copies / mL). A negative result must be combined with clinical  observations, patient history, and epidemiological information. If result is POSITIVE SARS-CoV-2 target nucleic acids are DETECTED. The SARS-CoV-2 RNA is  generally detectable in upper and lower  respiratory specimens dur ing the acute phase of infection.  Positive  results are indicative of active infection with SARS-CoV-2.  Clinical  correlation with patient history and other diagnostic information is  necessary to determine patient infection status.  Positive results do  not rule out bacterial infection or co-infection with other viruses. If result is PRESUMPTIVE POSTIVE SARS-CoV-2 nucleic acids MAY BE PRESENT.   A presumptive positive result was obtained on the submitted specimen  and confirmed on repeat testing.  While 2019 novel coronavirus  (SARS-CoV-2) nucleic acids may be present in the submitted sample  additional confirmatory testing may be necessary for epidemiological  and / or clinical management purposes  to differentiate between  SARS-CoV-2 and other Sarbecovirus currently known to infect humans.  If clinically indicated  additional testing with an alternate test  methodology [id number]) is advised. The SARS-CoV-2 RNA is generally  detectable in upper and lower respiratory sp ecimens during the acute  phase of infection. The expected result is Negative. Fact Sheet for Patients:  StrictlyIdeas.no Fact Sheet for Healthcare Providers: BankingDealers.co.za This test is not yet approved or cleared by the [place] FDA and has been authorized for detection and/or diagnosis of SARS-CoV-2 by FDA under an Emergency Use Authorization (EUA).  This EUA will remain in effect (meaning this test can be used) for the duration of the COVID-19 declaration under Section 564(b)(1) of the Act, 21 U.S.C. section 360bbb-3(b)(1), unless the authorization is terminated or revoked sooner. Performed at [facility], [place] [address] [place], [place] [address]   MRSA PCR Screening     Status: None   Collection Time: 07/23/18  6:43 AM  Result Value Ref Range Status   MRSA by PCR NEGATIVE  NEGATIVE Final    Comment:        The GeneXpert MRSA Assay (FDA approved for NASAL specimens only), is one component of a comprehensive MRSA colonization surveillance program. It is not intended to diagnose MRSA infection nor to guide or monitor treatment for MRSA infections. Performed at [facility], [place] [address], [place], [place] [address]          Radiology Studies: Dg Chest Port 1 View  Result Date: 07/24/2018 CLINICAL DATA:  Respiratory failure EXAM: PORTABLE CHEST 1 VIEW COMPARISON:  CTA chest dated 07/22/2018 FINDINGS: Multifocal patchy opacities in the lungs bilaterally, suggesting asymmetric interstitial edema versus multifocal pneumonia. Small left pleural effusion. No pneumothorax. Cardiomegaly.  Postsurgical changes related to prior CABG. Median sternotomy. IMPRESSION: Multifocal patchy airspace opacities, suggesting asymmetric interstitial edema versus multifocal pneumonia. Small left pleural effusion. Electronically Signed   By: Julian Hy M.D.   On: 07/24/2018 10:29        Scheduled Meds: . amLODipine  2.5 mg Oral Daily  . escitalopram  30 mg Oral Daily  . gabapentin  300 mg Oral QHS  . losartan  100 mg Oral Daily  . metoprolol tartrate  12.5 mg Oral BID  . pantoprazole  40 mg Oral Daily  . rosuvastatin  40 mg Oral Daily  . traMADol  50 mg Oral Q6H   Continuous Infusions: . doxycycline (VIBRAMYCIN) IV       LOS: 2 days    Time spent: 25 minutes spent in the coordination of care today.    Jonnie Finner, DO [facility] Pager [phone]  If 7PM-7AM, please contact night-coverage [email or web address] Password [email or web address] 07/25/2018, 1:31 PM

## 2018-07-26 ENCOUNTER — Inpatient Hospital Stay (HOSPITAL_COMMUNITY): Payer: PPO

## 2018-07-26 DIAGNOSIS — R7989 Other specified abnormal findings of blood chemistry: Secondary | ICD-10-CM

## 2018-07-26 LAB — LEGIONELLA PNEUMOPHILA SEROGP 1 UR AG: L. pneumophila Serogp 1 Ur Ag: NEGATIVE

## 2018-07-26 MED ORDER — DOXYCYCLINE HYCLATE 100 MG PO TABS: 100.0000 mg | ORAL_TABLET | Freq: Two times a day (BID) | ORAL | 0 refills | Status: AC

## 2018-07-26 MED ORDER — DM-GUAIFENESIN ER 30-600 MG PO TB12: 1.0000 | ORAL_TABLET | Freq: Two times a day (BID) | ORAL | 0 refills | Status: AC | PRN

## 2018-07-26 MED ORDER — DOXYCYCLINE HYCLATE 100 MG PO TABS
100.0000 mg | ORAL_TABLET | Freq: Two times a day (BID) | ORAL | Status: DC
  Administered 2018-07-26: 09:00:00 100 mg via ORAL
  Filled 2018-07-26: qty 1

## 2018-07-26 MED ORDER — OXYCODONE HCL 5 MG PO TABS: 5.0000 mg | ORAL_TABLET | Freq: Two times a day (BID) | ORAL | 0 refills | Status: AC | PRN

## 2018-07-26 NOTE — Plan of Care (Signed)
Care Plan has been reviewed:  Problem: Education: Goal: Knowledge of General Education information will improve Description Including pain rating scale, medication(s)/side effects and non-pharmacologic comfort measures Outcome: Progressing   Problem: Health Behavior/Discharge Planning: Goal: Ability to manage health-related needs will improve Outcome: Progressing  Problem: Safety: Goal: Ability to remain free from injury will improve Outcome: Progressing  Carlos Rowland, BSN,RN,PCCN-CMC,CSC

## 2018-07-26 NOTE — Progress Notes (Signed)
Patient ID: Carlos Rowland, male   DOB: Jul 24, 1950, 68 y.o.   MRN: [id number]    Subjective: Up in chair, no SOB  Objective: Vital signs in last 24 hours: Temp:  [98.1 F (36.7 C)-98.7 F (37.1 C)] 98.2 F (36.8 C) (05/18 0823) Pulse Rate:  [55-61] 57 (05/18 0823) Resp:  [14-20] 18 (05/18 0823) BP: (104-121)/(57-80) 114/59 (05/18 0823) SpO2:  [89 %-100 %] 100 % (05/18 0343) Last BM Date: 07/24/18  Intake/Output from previous day: 05/17 0701 - 05/18 0700 In: 816.6 [P.O.:800; IV Piggyback:16.6] Out: 700 [Urine:400; Stool:300] Intake/Output this shift: No intake/output data recorded.  General appearance: alert and cooperative Resp: clear to auscultation bilaterally Chest wall: left sided chest wall tenderness Cardio: regular rate and rhythm GI: soft, NT  Lab Results: CBC  Recent Labs    07/24/18 1053 07/25/18 0432  WBC 8.0 9.3  HGB 13.0 12.0*  HCT 39.5 35.7*  PLT 400 417*   BMET Recent Labs    07/24/18 0303 07/25/18 0432  NA 135 135  K 4.6 4.2  CL 99 97*  CO2 27 26  GLUCOSE 100* 109*  BUN 15 14  CREATININE 0.75 0.75  CALCIUM 9.0 8.8*   PT/INR No results for input(s): LABPROT, INR in the last 72 hours. ABG No results for input(s): PHART, HCO3 in the last 72 hours.  Invalid input(s): PCO2, PO2  Studies/Results: Dg Chest Port 1 View  Result Date: 07/26/2018 CLINICAL DATA:  Multiple rib fractures EXAM: PORTABLE CHEST 1 VIEW COMPARISON:  07/24/2018 FINDINGS: Multiple displaced rib fractures on the left. No change in small left effusion and left lower lobe consolidation. Negative for pneumothorax. Right lung remains clear. Prior CABG. Negative for heart failure. Improvement in upper lobe airspace disease bilaterally likely was related to pulmonary edema. IMPRESSION: Improvement in bilateral airspace disease most likely clearing edema Persistent left effusion and left lower lobe atelectasis with multiple left rib fractures. No pneumothorax. Electronically Signed    By: Franchot Gallo M.D.   On: 07/26/2018 08:42    Anti-infectives: Anti-infectives (From admission, onward)   Start     Dose/Rate Route Frequency Ordered Stop   07/26/18 1000  doxycycline (VIBRA-TABS) tablet 100 mg     100 mg Oral Every 12 hours 07/26/18 0653     07/25/18 1400  doxycycline (VIBRAMYCIN) 100 mg in sodium chloride 0.9 % 250 mL IVPB  Status:  Discontinued     100 mg 125 mL/hr over 120 Minutes Intravenous Every 12 hours 07/25/18 1330 07/26/18 1761      Assessment/Plan: Fall 1 week ago Left 4-9 rib FX with hemothorax - CXR slight improvement today Acute hypoxic respiratory failure - improved FEN - regular diet VTE - per medicine, SCDs ID - none Dispo - stable from trauma standpoint for D/C. Please have him F/U with his PCP.   LOS: 3 days    Georganna Skeans, MD, MPH, [facility]: [phone]  07/26/2018

## 2018-07-26 NOTE — Discharge Summary (Signed)
. Physician Discharge Summary  Carlos Rowland FMB:[id number] DOB: 1950-06-22 DOA: 07/23/2018  PCP: Raina Mina., MD  Admit date: 07/23/2018 Discharge date: 07/26/2018  Admitted From: [facility] Disposition:  Discharged to home  Recommendations for Outpatient Follow-up:  1. Follow up with PCP in 1-2 weeks 2. Please obtain BMP/CBC in one week  Discharge Condition: Stable  CODE STATUS: FULL   Brief/Interim Summary: Carlos Laymon Dugginsis a 68 y.o.malewith medical history significant ofhypertension, hyperlipidemia, GERD, CAD, mild dementia, who presents with altered mental status.  [facility]  Patient had fall 1 week agowhen was working on [employer] ladder,caused left rib fracture (fourth-ninth), atinyleft pneumothoraxandsmall tomoderate hemothorax per EDP. No LOC. Pt was noted to be more confused than his baseline. He was seen [facility] hospital initially.  Patient was found to have oxygen desaturation to 80% on room air,which improved to 95% on 2 L oxygen. CTA of chest negative for PE, but showedbilateral atypical infiltration with groundglass pattern, suspectingviral infection, such as COVID-19infection,but COVID19test is negative.Also showed rib fracture (4th to 9th).Dr.Osborn discussed case withon-call MD in [place] who recommended to keep pt there and repeat the COVID19 test, but they do not haveappropriatePPE, and also pt has othercondition and thereforeneedsto be transferred. Trauma surgeon, Dr. Brantley Stage was consulted, who agreed to consult on this case.  Patient was found to have negative urinalysis, WBC 8.8, hemoglobin 11.9 (14.8 on 09/04/2017),lactic acid of 0.9, negative troponin, proBNP 2 4, abnormal liver function (ALP 180, AST 2 8, ALT 212, total bilirubin 0.6), CRP 247.5, LDH 933, ferritin>1000. Temperature normal, blood pressure 137/65, heart rate 61, RR 20, oxygen saturation 95% on 2 L oxygen. CT head is negative for  acute intracranial abnormalities. CT of C-spine was not done.  At arrival to the floor,his mental status improved. He is alert, oriented x3. He moves all extremities normally. Patient reports severe left-sided chest pain, which is constant, sharp, 10 out of 10 in severity. Patient denies shortness of breath. He has a cough with a little yellow-colored mucus production. No fever or chills. Denies nausea vomiting, diarrhea, abdominal pain, symptoms of UTI. Oxygen saturation 97% on room air, temperature normal, heart rate 61, respiration rate 14, blood pressure 116/72. Patient is admitted to stepdown  Hemothorax and pneumothorax and rib fracture:  - pt has Small to moderate amount of hemothorax and a tiny amount of pneumothorax.  - Pt had oxygen desaturation earlier, but currently his saturation is 97% on room air.  - Patient complains of severe left-sided chest wall pain. Trauma surgeon, Dr. Brantley Stage was consulted by EDP, nurse will inform Dr. Brantley Stage of patient's arrival. - Pain control: PRN morphine and oxycodone - Incentive spirometry - f/u surgeon's recommendation which would be highly appreciated.     - ok for d/c from trauma standpoint  Coronary artery disease: s/p of CABG. Trop negative. - continue metoprolol and Crestor - Hold aspirin due to pneumothorax     - resume ASA at discharge  Possible atypical pneumonia:  - Patient states that he has some mild cough with little yellow-colored mucus production.  - CT angiogram showed atypical ground glass pattern for infiltration.  - COVID 19 negative x 2   - viral resp panel negative, flu negative - As needed albuterol inhaler and Mucinex for cough - On RA, ambulating well     - doxy x 5 days  GERD: -Protonix  HLD (hyperlipidemia):  -crestor  HTN:  - Continue home medications: Losartan, metoprolol, amlodipine - IV hydralazine  prn  Depression: - Lexapro  Fall:  - Seems to be an accident (bent ladder came out from underneath him).  - CT head is negative for acute intracranial abnormalities. - Patient denies neck pain.  Acute metabolic encephalopathy:  - seems have resolved.  - Currently patient is alert, oriented x3. No focal neurologic findings on physical examination. -resolved  Abnormal liver function:  - hepatitis panel and HIV antibodynegative - monitor  Discharge Diagnoses:  Principal Problem:   Hemothorax Active Problems:   Coronary artery disease   Depression   GERD (gastroesophageal reflux disease)   HLD (hyperlipidemia)   HTN (hypertension)   Hypertension   Fall   Pneumothorax   Acute metabolic encephalopathy   Abnormal liver function   Atypical pneumonia   Left rib fracture    Discharge Instructions 1. Follow up with PCP in 1 week.  Allergies as of 07/26/2018   No Known Allergies     Medication List    TAKE these medications   amLODipine 2.5 MG tablet Commonly known as:  NORVASC Take 2.5 mg by mouth daily.   aspirin 81 MG chewable tablet Chew 81 mg by mouth daily.   dextromethorphan-guaiFENesin 30-600 MG 12hr tablet Commonly known as:  MUCINEX DM Take 1 tablet by mouth 2 (two) times daily as needed for up to 5 days for cough.   donepezil 5 MG tablet Commonly known as:  ARICEPT Take 5 mg by mouth daily.   doxycycline 100 MG tablet Commonly known as:  VIBRA-TABS Take 1 tablet (100 mg total) by mouth every 12 (twelve) hours for 5 days.   escitalopram 20 MG tablet Commonly known as:  LEXAPRO Take 10-20 mg by mouth 2 (two) times a day. 10mg  in the am and 20mg  at bedtime   gabapentin 300 MG capsule Commonly known as:  NEURONTIN Take 300-600 mg by mouth at bedtime.   losartan 100 MG tablet Commonly known as:  COZAAR Take 100 mg by mouth daily.   metoprolol tartrate 25 MG tablet Commonly known as:  LOPRESSOR Take  12.5 mg by mouth 2 (two) times daily.   oxyCODONE 5 MG immediate release tablet Commonly known as:  Oxy IR/ROXICODONE Take 1 tablet (5 mg total) by mouth every 12 (twelve) hours as needed for up to 3 days for severe pain.   pantoprazole 40 MG tablet Commonly known as:  PROTONIX Take 40 mg by mouth daily.   promethazine 25 MG tablet Commonly known as:  PHENERGAN Take 25 mg by mouth every 6 (six) hours as needed for nausea.   rosuvastatin 40 MG tablet Commonly known as:  CRESTOR Take 40 mg by mouth daily.   traMADol 50 MG tablet Commonly known as:  ULTRAM Take 50 mg by mouth 2 (two) times daily as needed for moderate pain.       No Known Allergies  Consultations:  Trauma   Procedures/Studies: Dg Chest Port 1 View  Result Date: 07/26/2018 CLINICAL DATA:  Multiple rib fractures EXAM: PORTABLE CHEST 1 VIEW COMPARISON:  07/24/2018 FINDINGS: Multiple displaced rib fractures on the left. No change in small left effusion and left lower lobe consolidation. Negative for pneumothorax. Right lung remains clear. Prior CABG. Negative for heart failure. Improvement in upper lobe airspace disease bilaterally likely was related to pulmonary edema. IMPRESSION: Improvement in bilateral airspace disease most likely clearing edema Persistent left effusion and left lower lobe atelectasis with multiple left rib fractures. No pneumothorax. Electronically Signed   By: Franchot Gallo M.D.   On:  07/26/2018 08:42   Dg Chest Port 1 View  Result Date: 07/24/2018 CLINICAL DATA:  Respiratory failure EXAM: PORTABLE CHEST 1 VIEW COMPARISON:  CTA chest dated 07/22/2018 FINDINGS: Multifocal patchy opacities in the lungs bilaterally, suggesting asymmetric interstitial edema versus multifocal pneumonia. Small left pleural effusion. No pneumothorax. Cardiomegaly.  Postsurgical changes related to prior CABG. Median sternotomy. IMPRESSION: Multifocal patchy airspace opacities, suggesting asymmetric interstitial edema  versus multifocal pneumonia. Small left pleural effusion. Electronically Signed   By: Julian Hy M.D.   On: 07/24/2018 10:29   Vas [place] Lower Extremity Venous (dvt)  Result Date: 07/26/2018  Lower Venous Study Indications: Positive d dimer.  Performing Technologist: Abram Sander RVS  Examination Guidelines: A complete evaluation includes B-mode imaging, spectral Doppler, color Doppler, and power Doppler as needed of all accessible portions of each vessel. Bilateral testing is considered an integral part of a complete examination. Limited examinations for reoccurring indications may be performed as noted.  +---------+---------------+---------+-----------+----------+-------------------+ RIGHT    CompressibilityPhasicitySpontaneityPropertiesSummary             +---------+---------------+---------+-----------+----------+-------------------+ CFV      Full                                                             +---------+---------------+---------+-----------+----------+-------------------+ SFJ      Full                                                             +---------+---------------+---------+-----------+----------+-------------------+ FV Prox  Full                                                             +---------+---------------+---------+-----------+----------+-------------------+ FV Mid   Full                                                             +---------+---------------+---------+-----------+----------+-------------------+ FV DistalFull                                                             +---------+---------------+---------+-----------+----------+-------------------+ PFV      Full                                                             +---------+---------------+---------+-----------+----------+-------------------+ POP      Full                                                              +---------+---------------+---------+-----------+----------+-------------------+  PTV      Full                                                             +---------+---------------+---------+-----------+----------+-------------------+ PERO     Full                                                             +---------+---------------+---------+-----------+----------+-------------------+ Gastroc  Full                                         dialted with                                                              rouleaux flow       +---------+---------------+---------+-----------+----------+-------------------+   +---------+---------------+---------+-----------+----------+-------+ LEFT     CompressibilityPhasicitySpontaneityPropertiesSummary +---------+---------------+---------+-----------+----------+-------+ CFV      Full           Yes      Yes                          +---------+---------------+---------+-----------+----------+-------+ SFJ      Full                                                 +---------+---------------+---------+-----------+----------+-------+ FV Prox  Full                                                 +---------+---------------+---------+-----------+----------+-------+ FV Mid   Full                                                 +---------+---------------+---------+-----------+----------+-------+ FV DistalFull                                                 +---------+---------------+---------+-----------+----------+-------+ PFV      Full                                                 +---------+---------------+---------+-----------+----------+-------+ POP      Full           Yes  Yes                          +---------+---------------+---------+-----------+----------+-------+ PTV      Full                                                  +---------+---------------+---------+-----------+----------+-------+ PERO     Full                                                 +---------+---------------+---------+-----------+----------+-------+     Summary: Right: There is no evidence of deep vein thrombosis in the lower extremity. No cystic structure found in the popliteal fossa. Left: There is no evidence of deep vein thrombosis in the lower extremity. No cystic structure found in the popliteal fossa.  *See table(s) above for measurements and observations.    Preliminary        Subjective: "I feel good. Just waiting for my wife."  Discharge Exam: Vitals:   07/26/18 0343 07/26/18 0823  BP: 114/80 (!) 114/59  Pulse: 60 (!) 57  Resp: 19 18  Temp: 98.7 F (37.1 C) 98.2 F (36.8 C)  SpO2: 100%    Vitals:   07/25/18 2322 07/26/18 0200 07/26/18 0343 07/26/18 0823  BP: 117/67  114/80 (!) 114/59  Pulse: (!) 56 (!) 55 60 (!) 57  Resp: 20 16 19 18   Temp: 98.1 F (36.7 C)  98.7 F (37.1 C) 98.2 F (36.8 C)  TempSrc: Oral  Oral Oral  SpO2: (!) 89% 100% 100%     General:67 y.o.maleresting inchairin NAD Cardiovascular: RRR, +S1, S2, no m/g/r, equal pulses throughout Respiratory:clear anteriorly, decreased on left base GI: BS+, NDNT, no masses noted, no organomegaly noted MSK: No e/c/c Skin: No rashes, bruises, ulcerations noted Neuro: A&O x 3, no focal deficits    The results of significant diagnostics from this hospitalization (including imaging, microbiology, ancillary and laboratory) are listed below for reference.     Microbiology: Recent Results (from the past 240 hour(s))  Respiratory Panel by PCR     Status: None   Collection Time: 07/23/18  6:41 AM  Result Value Ref Range Status   Adenovirus NOT DETECTED NOT DETECTED Final   Coronavirus 229E NOT DETECTED NOT DETECTED Final    Comment: (NOTE) The Coronavirus on the Respiratory Panel, DOES NOT test for the novel  Coronavirus (2019 nCoV)     Coronavirus HKU1 NOT DETECTED NOT DETECTED Final   Coronavirus NL63 NOT DETECTED NOT DETECTED Final   Coronavirus OC43 NOT DETECTED NOT DETECTED Final   Metapneumovirus NOT DETECTED NOT DETECTED Final   Rhinovirus / Enterovirus NOT DETECTED NOT DETECTED Final   Influenza A NOT DETECTED NOT DETECTED Final   Influenza B NOT DETECTED NOT DETECTED Final   Parainfluenza Virus 1 NOT DETECTED NOT DETECTED Final   Parainfluenza Virus 2 NOT DETECTED NOT DETECTED Final   Parainfluenza Virus 3 NOT DETECTED NOT DETECTED Final   Parainfluenza Virus 4 NOT DETECTED NOT DETECTED Final   Respiratory Syncytial Virus NOT DETECTED NOT DETECTED Final   Bordetella pertussis NOT DETECTED NOT DETECTED Final   Chlamydophila pneumoniae NOT DETECTED NOT DETECTED Final   Mycoplasma pneumoniae NOT DETECTED NOT DETECTED  Final    Comment: Performed at [facility], [place] [address], [place], [place] [address]  SARS Coronavirus 2 (CEPHEID - Performed in [facility] hospital lab), Hosp Order     Status: None   Collection Time: 07/23/18  6:42 AM  Result Value Ref Range Status   SARS Coronavirus 2 NEGATIVE NEGATIVE Final    Comment: (NOTE) If result is NEGATIVE SARS-CoV-2 target nucleic acids are NOT DETECTED. The SARS-CoV-2 RNA is generally detectable in upper and lower  respiratory specimens during the acute phase of infection. The lowest  concentration of SARS-CoV-2 viral copies this assay can detect is 250  copies / mL. A negative result does not preclude SARS-CoV-2 infection  and should not be used as the sole basis for treatment or other  patient management decisions.  A negative result may occur with  improper specimen collection / handling, submission of specimen other  than nasopharyngeal swab, presence of viral mutation(s) within the  areas targeted by this assay, and inadequate number of viral copies  (<250 copies / mL). A negative result must be combined with clinical  observations, patient history,  and epidemiological information. If result is POSITIVE SARS-CoV-2 target nucleic acids are DETECTED. The SARS-CoV-2 RNA is generally detectable in upper and lower  respiratory specimens dur ing the acute phase of infection.  Positive  results are indicative of active infection with SARS-CoV-2.  Clinical  correlation with patient history and other diagnostic information is  necessary to determine patient infection status.  Positive results do  not rule out bacterial infection or co-infection with other viruses. If result is PRESUMPTIVE POSTIVE SARS-CoV-2 nucleic acids MAY BE PRESENT.   A presumptive positive result was obtained on the submitted specimen  and confirmed on repeat testing.  While 2019 novel coronavirus  (SARS-CoV-2) nucleic acids may be present in the submitted sample  additional confirmatory testing may be necessary for epidemiological  and / or clinical management purposes  to differentiate between  SARS-CoV-2 and other Sarbecovirus currently known to infect humans.  If clinically indicated additional testing with an alternate test  methodology [phone]) is advised. The SARS-CoV-2 RNA is generally  detectable in upper and lower respiratory sp ecimens during the acute  phase of infection. The expected result is Negative. Fact Sheet for Patients:  StrictlyIdeas.no Fact Sheet for Healthcare Providers: BankingDealers.co.za This test is not yet approved or cleared by the [place] FDA and has been authorized for detection and/or diagnosis of SARS-CoV-2 by FDA under an Emergency Use Authorization (EUA).  This EUA will remain in effect (meaning this test can be used) for the duration of the COVID-19 declaration under Section 564(b)(1) of the Act, 21 U.S.C. section 360bbb-3(b)(1), unless the authorization is terminated or revoked sooner. Performed at [facility], [place] [address], [place], [place] [address]   MRSA PCR  Screening     Status: None   Collection Time: 07/23/18  6:43 AM  Result Value Ref Range Status   MRSA by PCR NEGATIVE NEGATIVE Final    Comment:        The GeneXpert MRSA Assay (FDA approved for NASAL specimens only), is one component of a comprehensive MRSA colonization surveillance program. It is not intended to diagnose MRSA infection nor to guide or monitor treatment for MRSA infections. Performed at [facility], [place] [address] [place], [place] [address]      Labs: BNP (last 3 results) No results for input(s): BNP in the last 8760 hours. Basic  Metabolic Panel: Recent Labs  Lab 07/23/18 1346 07/24/18 0303 07/25/18 0432  NA 135 135 135  K 4.4 4.6 4.2  CL 98 99 97*  CO2 28 27 26   GLUCOSE 106* 100* 109*  BUN 15 15 14   CREATININE 0.81 0.75 0.75  CALCIUM 8.9 9.0 8.8*  MG 2.2  --   --   PHOS  --   --  4.2   Liver Function Tests: Recent Labs  Lab 07/23/18 1346 07/24/18 0303 07/25/18 0432  AST 89* 128*  --   ALT 126* 153*  --   ALKPHOS 158* 168*  --   BILITOT 0.7 0.6  --   PROT 5.9* 6.0*  --   ALBUMIN 2.4* 2.4* 2.4*   No results for input(s): LIPASE, AMYLASE in the last 168 hours. No results for input(s): AMMONIA in the last 168 hours. CBC: Recent Labs  Lab 07/23/18 1346 07/24/18 1053 07/25/18 0432  WBC 10.9* 8.0 9.3  NEUTROABS 7.8* 5.4 5.7  HGB 11.8* 13.0 12.0*  HCT 36.1* 39.5 35.7*  MCV 92.3 91.6 90.8  PLT 367 400 417*   Cardiac Enzymes: No results for input(s): CKTOTAL, CKMB, CKMBINDEX, TROPONINI in the last 168 hours. BNP: Invalid input(s): POCBNP CBG: Recent Labs  Lab 07/23/18 0631  GLUCAP 110*   D-Dimer No results for input(s): DDIMER in the last 72 hours. Hgb A1c No results for input(s): HGBA1C in the last 72 hours. Lipid Profile No results for input(s): CHOL, HDL, LDLCALC, TRIG, CHOLHDL, LDLDIRECT in the last 72 hours. Thyroid function studies No results for input(s): TSH, T4TOTAL, T3FREE, THYROIDAB in the last 72  hours.  Invalid input(s): FREET3 Anemia work up No results for input(s): VITAMINB12, FOLATE, FERRITIN, TIBC, IRON, RETICCTPCT in the last 72 hours. Urinalysis No results found for: COLORURINE, APPEARANCEUR, LABSPEC, PHURINE, GLUCOSEU, HGBUR, BILIRUBINUR, KETONESUR, PROTEINUR, UROBILINOGEN, NITRITE, LEUKOCYTESUR Sepsis Labs Invalid input(s): PROCALCITONIN,  WBC,  LACTICIDVEN Microbiology Recent Results (from the past 240 hour(s))  Respiratory Panel by PCR     Status: None   Collection Time: 07/23/18  6:41 AM  Result Value Ref Range Status   Adenovirus NOT DETECTED NOT DETECTED Final   Coronavirus 229E NOT DETECTED NOT DETECTED Final    Comment: (NOTE) The Coronavirus on the Respiratory Panel, DOES NOT test for the novel  Coronavirus (2019 nCoV)    Coronavirus HKU1 NOT DETECTED NOT DETECTED Final   Coronavirus NL63 NOT DETECTED NOT DETECTED Final   Coronavirus OC43 NOT DETECTED NOT DETECTED Final   Metapneumovirus NOT DETECTED NOT DETECTED Final   Rhinovirus / Enterovirus NOT DETECTED NOT DETECTED Final   Influenza A NOT DETECTED NOT DETECTED Final   Influenza B NOT DETECTED NOT DETECTED Final   Parainfluenza Virus 1 NOT DETECTED NOT DETECTED Final   Parainfluenza Virus 2 NOT DETECTED NOT DETECTED Final   Parainfluenza Virus 3 NOT DETECTED NOT DETECTED Final   Parainfluenza Virus 4 NOT DETECTED NOT DETECTED Final   Respiratory Syncytial Virus NOT DETECTED NOT DETECTED Final   Bordetella pertussis NOT DETECTED NOT DETECTED Final   Chlamydophila pneumoniae NOT DETECTED NOT DETECTED Final   Mycoplasma pneumoniae NOT DETECTED NOT DETECTED Final    Comment: Performed at [facility], [address], [place], [place] [address]  SARS Coronavirus 2 (CEPHEID - Performed in [facility] hospital lab), Hosp Order     Status: None   Collection Time: 07/23/18  6:42 AM  Result Value Ref Range Status   SARS Coronavirus 2 NEGATIVE NEGATIVE Final  Comment: (NOTE) If result is  NEGATIVE SARS-CoV-2 target nucleic acids are NOT DETECTED. The SARS-CoV-2 RNA is generally detectable in upper and lower  respiratory specimens during the acute phase of infection. The lowest  concentration of SARS-CoV-2 viral copies this assay can detect is 250  copies / mL. A negative result does not preclude SARS-CoV-2 infection  and should not be used as the sole basis for treatment or other  patient management decisions.  A negative result may occur with  improper specimen collection / handling, submission of specimen other  than nasopharyngeal swab, presence of viral mutation(s) within the  areas targeted by this assay, and inadequate number of viral copies  (<250 copies / mL). A negative result must be combined with clinical  observations, patient history, and epidemiological information. If result is POSITIVE SARS-CoV-2 target nucleic acids are DETECTED. The SARS-CoV-2 RNA is generally detectable in upper and lower  respiratory specimens dur ing the acute phase of infection.  Positive  results are indicative of active infection with SARS-CoV-2.  Clinical  correlation with patient history and other diagnostic information is  necessary to determine patient infection status.  Positive results do  not rule out bacterial infection or co-infection with other viruses. If result is PRESUMPTIVE POSTIVE SARS-CoV-2 nucleic acids MAY BE PRESENT.   A presumptive positive result was obtained on the submitted specimen  and confirmed on repeat testing.  While 2019 novel coronavirus  (SARS-CoV-2) nucleic acids may be present in the submitted sample  additional confirmatory testing may be necessary for epidemiological  and / or clinical management purposes  to differentiate between  SARS-CoV-2 and other Sarbecovirus currently known to infect humans.  If clinically indicated additional testing with an alternate test  methodology [phone]) is advised. The SARS-CoV-2 RNA is generally  detectable  in upper and lower respiratory sp ecimens during the acute  phase of infection. The expected result is Negative. Fact Sheet for Patients:  StrictlyIdeas.no Fact Sheet for Healthcare Providers: BankingDealers.co.za This test is not yet approved or cleared by the [place] FDA and has been authorized for detection and/or diagnosis of SARS-CoV-2 by FDA under an Emergency Use Authorization (EUA).  This EUA will remain in effect (meaning this test can be used) for the duration of the COVID-19 declaration under Section 564(b)(1) of the Act, 21 U.S.C. section 360bbb-3(b)(1), unless the authorization is terminated or revoked sooner. Performed at [facility], [place] [address], [place], [place] [address]   MRSA PCR Screening     Status: None   Collection Time: 07/23/18  6:43 AM  Result Value Ref Range Status   MRSA by PCR NEGATIVE NEGATIVE Final    Comment:        The GeneXpert MRSA Assay (FDA approved for NASAL specimens only), is one component of a comprehensive MRSA colonization surveillance program. It is not intended to diagnose MRSA infection nor to guide or monitor treatment for MRSA infections. Performed at [facility], [place] [address], [place],  [address]      Time coordinating discharge: 35 minutes spent in the coordination this discharge today.   SIGNED:   Jonnie Finner, DO  [facility] 07/26/2018, 11:31 AM Pager   If 7PM-7AM, please contact night-coverage [email or web address] Password [email or web address]

## 2018-07-26 NOTE — Progress Notes (Signed)
Lower extremity venous has been completed.   Preliminary results in CV Proc.   Abram Sander 07/26/2018 10:39 AM

## 2018-07-26 NOTE — Care Management Important Message (Signed)
Important Message  Patient Details  Name: Carlos Rowland MRN: [id number] Date of Birth: March 14, 1950   Medicare Important Message Given:  Yes    Jada Kuhnert [address] Circle 07/26/2018, 3:38 PM

## 2018-08-05 DIAGNOSIS — R5381 Other malaise: Secondary | ICD-10-CM | POA: Diagnosis not present

## 2018-08-05 DIAGNOSIS — S2242XD Multiple fractures of ribs, left side, subsequent encounter for fracture with routine healing: Secondary | ICD-10-CM | POA: Diagnosis not present

## 2018-08-05 DIAGNOSIS — I10 Essential (primary) hypertension: Secondary | ICD-10-CM | POA: Diagnosis not present

## 2018-08-05 DIAGNOSIS — R945 Abnormal results of liver function studies: Secondary | ICD-10-CM | POA: Diagnosis not present

## 2018-08-05 DIAGNOSIS — I252 Old myocardial infarction: Secondary | ICD-10-CM | POA: Diagnosis not present

## 2018-08-05 DIAGNOSIS — R5383 Other fatigue: Secondary | ICD-10-CM | POA: Diagnosis not present

## 2018-08-05 DIAGNOSIS — Z8709 Personal history of other diseases of the respiratory system: Secondary | ICD-10-CM | POA: Diagnosis not present

## 2018-08-05 DIAGNOSIS — R413 Other amnesia: Secondary | ICD-10-CM | POA: Diagnosis not present

## 2018-08-17 DIAGNOSIS — G3184 Mild cognitive impairment, so stated: Secondary | ICD-10-CM | POA: Diagnosis not present

## 2018-08-17 DIAGNOSIS — R413 Other amnesia: Secondary | ICD-10-CM | POA: Diagnosis not present

## 2018-09-28 DIAGNOSIS — I6789 Other cerebrovascular disease: Secondary | ICD-10-CM | POA: Diagnosis not present

## 2018-09-28 DIAGNOSIS — R5383 Other fatigue: Secondary | ICD-10-CM | POA: Diagnosis not present

## 2018-09-28 DIAGNOSIS — R5381 Other malaise: Secondary | ICD-10-CM | POA: Diagnosis not present

## 2018-09-28 DIAGNOSIS — E538 Deficiency of other specified B group vitamins: Secondary | ICD-10-CM | POA: Diagnosis not present

## 2018-09-28 DIAGNOSIS — I10 Essential (primary) hypertension: Secondary | ICD-10-CM | POA: Diagnosis not present

## 2018-09-28 DIAGNOSIS — I739 Peripheral vascular disease, unspecified: Secondary | ICD-10-CM | POA: Diagnosis not present

## 2018-09-28 DIAGNOSIS — R413 Other amnesia: Secondary | ICD-10-CM | POA: Diagnosis not present

## 2018-09-28 DIAGNOSIS — R945 Abnormal results of liver function studies: Secondary | ICD-10-CM | POA: Diagnosis not present

## 2018-09-28 DIAGNOSIS — I25810 Atherosclerosis of coronary artery bypass graft(s) without angina pectoris: Secondary | ICD-10-CM | POA: Diagnosis not present

## 2018-09-28 DIAGNOSIS — M48061 Spinal stenosis, lumbar region without neurogenic claudication: Secondary | ICD-10-CM | POA: Diagnosis not present

## 2018-09-28 DIAGNOSIS — M8949 Other hypertrophic osteoarthropathy, multiple sites: Secondary | ICD-10-CM | POA: Diagnosis not present

## 2018-09-28 DIAGNOSIS — F419 Anxiety disorder, unspecified: Secondary | ICD-10-CM | POA: Diagnosis not present

## 2018-09-28 DIAGNOSIS — E782 Mixed hyperlipidemia: Secondary | ICD-10-CM | POA: Diagnosis not present

## 2018-09-28 DIAGNOSIS — F5101 Primary insomnia: Secondary | ICD-10-CM | POA: Diagnosis not present

## 2018-09-30 DIAGNOSIS — L570 Actinic keratosis: Secondary | ICD-10-CM | POA: Diagnosis not present

## 2018-09-30 DIAGNOSIS — D485 Neoplasm of uncertain behavior of skin: Secondary | ICD-10-CM | POA: Diagnosis not present

## 2018-10-08 DIAGNOSIS — E782 Mixed hyperlipidemia: Secondary | ICD-10-CM | POA: Diagnosis not present

## 2018-10-08 DIAGNOSIS — I252 Old myocardial infarction: Secondary | ICD-10-CM | POA: Diagnosis not present

## 2018-10-08 DIAGNOSIS — I2510 Atherosclerotic heart disease of native coronary artery without angina pectoris: Secondary | ICD-10-CM | POA: Diagnosis not present

## 2018-10-08 DIAGNOSIS — I10 Essential (primary) hypertension: Secondary | ICD-10-CM | POA: Diagnosis not present

## 2018-11-01 DIAGNOSIS — R609 Edema, unspecified: Secondary | ICD-10-CM | POA: Diagnosis not present

## 2018-11-01 DIAGNOSIS — R600 Localized edema: Secondary | ICD-10-CM | POA: Diagnosis not present

## 2018-11-01 DIAGNOSIS — J40 Bronchitis, not specified as acute or chronic: Secondary | ICD-10-CM | POA: Diagnosis not present

## 2018-11-01 DIAGNOSIS — I83893 Varicose veins of bilateral lower extremities with other complications: Secondary | ICD-10-CM | POA: Diagnosis not present

## 2018-11-01 DIAGNOSIS — R0602 Shortness of breath: Secondary | ICD-10-CM | POA: Diagnosis not present

## 2018-11-01 DIAGNOSIS — I10 Essential (primary) hypertension: Secondary | ICD-10-CM | POA: Diagnosis not present

## 2018-11-01 DIAGNOSIS — R5381 Other malaise: Secondary | ICD-10-CM | POA: Diagnosis not present

## 2018-11-01 DIAGNOSIS — R5383 Other fatigue: Secondary | ICD-10-CM | POA: Diagnosis not present

## 2019-01-13 ENCOUNTER — Emergency Department (HOSPITAL_COMMUNITY): Payer: PPO

## 2019-01-13 ENCOUNTER — Inpatient Hospital Stay (HOSPITAL_COMMUNITY)
Admission: EM | Admit: 2019-01-13 | Discharge: 2019-01-19 | DRG: 551 | Disposition: A | Discharge status: Skilled Nursing Facility | Payer: PPO | Attending: Surgery | Admitting: Surgery

## 2019-01-13 DIAGNOSIS — S32009A Unspecified fracture of unspecified lumbar vertebra, initial encounter for closed fracture: Secondary | ICD-10-CM

## 2019-01-13 DIAGNOSIS — E538 Deficiency of other specified B group vitamins: Secondary | ICD-10-CM | POA: Diagnosis present

## 2019-01-13 DIAGNOSIS — S065XAA Traumatic subdural hemorrhage with loss of consciousness status unknown, initial encounter: Secondary | ICD-10-CM

## 2019-01-13 DIAGNOSIS — S80812D Abrasion, left lower leg, subsequent encounter: Secondary | ICD-10-CM | POA: Diagnosis not present

## 2019-01-13 DIAGNOSIS — S22038A Other fracture of third thoracic vertebra, initial encounter for closed fracture: Secondary | ICD-10-CM | POA: Diagnosis not present

## 2019-01-13 DIAGNOSIS — E782 Mixed hyperlipidemia: Secondary | ICD-10-CM | POA: Diagnosis not present

## 2019-01-13 DIAGNOSIS — S32018A Other fracture of first lumbar vertebra, initial encounter for closed fracture: Secondary | ICD-10-CM | POA: Diagnosis not present

## 2019-01-13 DIAGNOSIS — M2550 Pain in unspecified joint: Secondary | ICD-10-CM | POA: Diagnosis not present

## 2019-01-13 DIAGNOSIS — R0602 Shortness of breath: Secondary | ICD-10-CM | POA: Diagnosis not present

## 2019-01-13 DIAGNOSIS — R4189 Other symptoms and signs involving cognitive functions and awareness: Secondary | ICD-10-CM | POA: Diagnosis not present

## 2019-01-13 DIAGNOSIS — Z87891 Personal history of nicotine dependence: Secondary | ICD-10-CM

## 2019-01-13 DIAGNOSIS — S2241XA Multiple fractures of ribs, right side, initial encounter for closed fracture: Secondary | ICD-10-CM | POA: Diagnosis not present

## 2019-01-13 DIAGNOSIS — F5101 Primary insomnia: Secondary | ICD-10-CM | POA: Diagnosis not present

## 2019-01-13 DIAGNOSIS — F419 Anxiety disorder, unspecified: Secondary | ICD-10-CM | POA: Diagnosis present

## 2019-01-13 DIAGNOSIS — S3993XA Unspecified injury of pelvis, initial encounter: Secondary | ICD-10-CM | POA: Diagnosis not present

## 2019-01-13 DIAGNOSIS — S299XXA Unspecified injury of thorax, initial encounter: Secondary | ICD-10-CM | POA: Diagnosis not present

## 2019-01-13 DIAGNOSIS — T148XXD Other injury of unspecified body region, subsequent encounter: Secondary | ICD-10-CM | POA: Diagnosis not present

## 2019-01-13 DIAGNOSIS — R58 Hemorrhage, not elsewhere classified: Secondary | ICD-10-CM | POA: Diagnosis not present

## 2019-01-13 DIAGNOSIS — E860 Dehydration: Secondary | ICD-10-CM | POA: Diagnosis present

## 2019-01-13 DIAGNOSIS — S27321A Contusion of lung, unilateral, initial encounter: Secondary | ICD-10-CM | POA: Diagnosis not present

## 2019-01-13 DIAGNOSIS — E875 Hyperkalemia: Secondary | ICD-10-CM | POA: Diagnosis present

## 2019-01-13 DIAGNOSIS — I25810 Atherosclerosis of coronary artery bypass graft(s) without angina pectoris: Secondary | ICD-10-CM | POA: Diagnosis not present

## 2019-01-13 DIAGNOSIS — I739 Peripheral vascular disease, unspecified: Secondary | ICD-10-CM | POA: Diagnosis present

## 2019-01-13 DIAGNOSIS — Z20828 Contact with and (suspected) exposure to other viral communicable diseases: Secondary | ICD-10-CM | POA: Diagnosis not present

## 2019-01-13 DIAGNOSIS — R0902 Hypoxemia: Secondary | ICD-10-CM

## 2019-01-13 DIAGNOSIS — S32019A Unspecified fracture of first lumbar vertebra, initial encounter for closed fracture: Principal | ICD-10-CM | POA: Diagnosis present

## 2019-01-13 DIAGNOSIS — S199XXA Unspecified injury of neck, initial encounter: Secondary | ICD-10-CM | POA: Diagnosis not present

## 2019-01-13 DIAGNOSIS — K219 Gastro-esophageal reflux disease without esophagitis: Secondary | ICD-10-CM | POA: Diagnosis present

## 2019-01-13 DIAGNOSIS — M8949 Other hypertrophic osteoarthropathy, multiple sites: Secondary | ICD-10-CM | POA: Diagnosis not present

## 2019-01-13 DIAGNOSIS — I4510 Unspecified right bundle-branch block: Secondary | ICD-10-CM | POA: Diagnosis not present

## 2019-01-13 DIAGNOSIS — D62 Acute posthemorrhagic anemia: Secondary | ICD-10-CM | POA: Diagnosis not present

## 2019-01-13 DIAGNOSIS — T07XXXA Unspecified multiple injuries, initial encounter: Secondary | ICD-10-CM | POA: Diagnosis not present

## 2019-01-13 DIAGNOSIS — R5381 Other malaise: Secondary | ICD-10-CM | POA: Diagnosis not present

## 2019-01-13 DIAGNOSIS — S2241XD Multiple fractures of ribs, right side, subsequent encounter for fracture with routine healing: Secondary | ICD-10-CM | POA: Diagnosis not present

## 2019-01-13 DIAGNOSIS — F0390 Unspecified dementia without behavioral disturbance: Secondary | ICD-10-CM | POA: Diagnosis not present

## 2019-01-13 DIAGNOSIS — K567 Ileus, unspecified: Secondary | ICD-10-CM | POA: Diagnosis not present

## 2019-01-13 DIAGNOSIS — M48061 Spinal stenosis, lumbar region without neurogenic claudication: Secondary | ICD-10-CM | POA: Diagnosis not present

## 2019-01-13 DIAGNOSIS — S60511A Abrasion of right hand, initial encounter: Secondary | ICD-10-CM | POA: Diagnosis present

## 2019-01-13 DIAGNOSIS — S22008A Other fracture of unspecified thoracic vertebra, initial encounter for closed fracture: Secondary | ICD-10-CM

## 2019-01-13 DIAGNOSIS — W19XXXA Unspecified fall, initial encounter: Secondary | ICD-10-CM | POA: Diagnosis present

## 2019-01-13 DIAGNOSIS — I10 Essential (primary) hypertension: Secondary | ICD-10-CM | POA: Diagnosis not present

## 2019-01-13 DIAGNOSIS — S065X9A Traumatic subdural hemorrhage with loss of consciousness of unspecified duration, initial encounter: Secondary | ICD-10-CM

## 2019-01-13 DIAGNOSIS — G8929 Other chronic pain: Secondary | ICD-10-CM | POA: Diagnosis present

## 2019-01-13 DIAGNOSIS — S62009D Unspecified fracture of navicular [scaphoid] bone of unspecified wrist, subsequent encounter for fracture with routine healing: Secondary | ICD-10-CM | POA: Diagnosis not present

## 2019-01-13 DIAGNOSIS — S2242XD Multiple fractures of ribs, left side, subsequent encounter for fracture with routine healing: Secondary | ICD-10-CM | POA: Diagnosis not present

## 2019-01-13 DIAGNOSIS — S32000A Wedge compression fracture of unspecified lumbar vertebra, initial encounter for closed fracture: Secondary | ICD-10-CM

## 2019-01-13 DIAGNOSIS — S2243XA Multiple fractures of ribs, bilateral, initial encounter for closed fracture: Secondary | ICD-10-CM

## 2019-01-13 DIAGNOSIS — N179 Acute kidney failure, unspecified: Secondary | ICD-10-CM | POA: Diagnosis not present

## 2019-01-13 DIAGNOSIS — K661 Hemoperitoneum: Secondary | ICD-10-CM | POA: Diagnosis not present

## 2019-01-13 DIAGNOSIS — S22028A Other fracture of second thoracic vertebra, initial encounter for closed fracture: Secondary | ICD-10-CM | POA: Diagnosis not present

## 2019-01-13 DIAGNOSIS — Z8249 Family history of ischemic heart disease and other diseases of the circulatory system: Secondary | ICD-10-CM

## 2019-01-13 DIAGNOSIS — S301XXA Contusion of abdominal wall, initial encounter: Secondary | ICD-10-CM | POA: Diagnosis not present

## 2019-01-13 DIAGNOSIS — S2242XA Multiple fractures of ribs, left side, initial encounter for closed fracture: Secondary | ICD-10-CM

## 2019-01-13 DIAGNOSIS — S32000D Wedge compression fracture of unspecified lumbar vertebra, subsequent encounter for fracture with routine healing: Secondary | ICD-10-CM | POA: Diagnosis not present

## 2019-01-13 DIAGNOSIS — S32040A Wedge compression fracture of fourth lumbar vertebra, initial encounter for closed fracture: Secondary | ICD-10-CM | POA: Diagnosis not present

## 2019-01-13 DIAGNOSIS — W11XXXA Fall on and from ladder, initial encounter: Secondary | ICD-10-CM | POA: Diagnosis present

## 2019-01-13 DIAGNOSIS — S22039A Unspecified fracture of third thoracic vertebra, initial encounter for closed fracture: Secondary | ICD-10-CM | POA: Diagnosis not present

## 2019-01-13 DIAGNOSIS — S2243XD Multiple fractures of ribs, bilateral, subsequent encounter for fracture with routine healing: Secondary | ICD-10-CM | POA: Diagnosis not present

## 2019-01-13 DIAGNOSIS — S065X9D Traumatic subdural hemorrhage with loss of consciousness of unspecified duration, subsequent encounter: Secondary | ICD-10-CM | POA: Diagnosis not present

## 2019-01-13 DIAGNOSIS — M549 Dorsalgia, unspecified: Secondary | ICD-10-CM | POA: Diagnosis present

## 2019-01-13 DIAGNOSIS — S22029A Unspecified fracture of second thoracic vertebra, initial encounter for closed fracture: Secondary | ICD-10-CM | POA: Diagnosis present

## 2019-01-13 DIAGNOSIS — R52 Pain, unspecified: Secondary | ICD-10-CM | POA: Diagnosis not present

## 2019-01-13 DIAGNOSIS — Z85828 Personal history of other malignant neoplasm of skin: Secondary | ICD-10-CM

## 2019-01-13 DIAGNOSIS — Z7401 Bed confinement status: Secondary | ICD-10-CM | POA: Diagnosis not present

## 2019-01-13 DIAGNOSIS — S32048A Other fracture of fourth lumbar vertebra, initial encounter for closed fracture: Secondary | ICD-10-CM | POA: Diagnosis not present

## 2019-01-13 DIAGNOSIS — S0990XA Unspecified injury of head, initial encounter: Secondary | ICD-10-CM | POA: Diagnosis not present

## 2019-01-13 DIAGNOSIS — Z9181 History of falling: Secondary | ICD-10-CM

## 2019-01-13 DIAGNOSIS — J9811 Atelectasis: Secondary | ICD-10-CM | POA: Diagnosis not present

## 2019-01-13 DIAGNOSIS — S22008D Other fracture of unspecified thoracic vertebra, subsequent encounter for fracture with routine healing: Secondary | ICD-10-CM | POA: Diagnosis not present

## 2019-01-13 DIAGNOSIS — J90 Pleural effusion, not elsewhere classified: Secondary | ICD-10-CM | POA: Diagnosis not present

## 2019-01-13 LAB — SAMPLE TO BLOOD BANK

## 2019-01-13 LAB — COMPREHENSIVE METABOLIC PANEL
ALT: 54 U/L — ABNORMAL HIGH (ref 0–44)
AST: 76 U/L — ABNORMAL HIGH (ref 15–41)
Albumin: 3.9 g/dL (ref 3.5–5.0)
Alkaline Phosphatase: 65 U/L (ref 38–126)
Anion gap: 15 (ref 5–15)
BUN: 25 mg/dL — ABNORMAL HIGH (ref 8–23)
CO2: 19 mmol/L — ABNORMAL LOW (ref 22–32)
Calcium: 9.6 mg/dL (ref 8.9–10.3)
Chloride: 107 mmol/L (ref 98–111)
Creatinine, Ser: 1.81 mg/dL — ABNORMAL HIGH (ref 0.61–1.24)
GFR calc Af Amer: 44 mL/min — ABNORMAL LOW (ref >60)
GFR calc non Af Amer: 38 mL/min — ABNORMAL LOW (ref >60)
Glucose, Bld: 113 mg/dL — ABNORMAL HIGH (ref 70–99)
Potassium: 4.3 mmol/L (ref 3.5–5.1)
Sodium: 141 mmol/L (ref 135–145)
Total Bilirubin: 1.1 mg/dL (ref 0.3–1.2)
Total Protein: 6.5 g/dL (ref 6.5–8.1)

## 2019-01-13 LAB — I-STAT CHEM 8, ED
BUN: 24 mg/dL — ABNORMAL HIGH (ref 8–23)
Calcium, Ion: 1.18 mmol/L (ref 1.15–1.40)
Chloride: 109 mmol/L (ref 98–111)
Creatinine, Ser: 1.5 mg/dL — ABNORMAL HIGH (ref 0.61–1.24)
Glucose, Bld: 104 mg/dL — ABNORMAL HIGH (ref 70–99)
HCT: 42 % (ref 39.0–52.0)
Hemoglobin: 14.3 g/dL (ref 13.0–17.0)
Potassium: 4.2 mmol/L (ref 3.5–5.1)
Sodium: 142 mmol/L (ref 135–145)
TCO2: 18 mmol/L — ABNORMAL LOW (ref 22–32)

## 2019-01-13 LAB — CBC
HCT: 42.7 % (ref 39.0–52.0)
Hemoglobin: 14 g/dL (ref 13.0–17.0)
MCH: 30.4 pg (ref 26.0–34.0)
MCHC: 32.8 g/dL (ref 30.0–36.0)
MCV: 92.8 fL (ref 80.0–100.0)
Platelets: 228 10*3/uL (ref 150–400)
RBC: 4.6 MIL/uL (ref 4.22–5.81)
RDW: 12.9 % (ref 11.5–15.5)
WBC: 18 10*3/uL — ABNORMAL HIGH (ref 4.0–10.5)
nRBC: 0 % (ref 0.0–0.2)

## 2019-01-13 LAB — LACTIC ACID, PLASMA: Lactic Acid, Venous: 2.7 mmol/L (ref 0.5–1.9)

## 2019-01-13 LAB — ETHANOL: Alcohol, Ethyl (B): <10 mg/dL (ref <10)

## 2019-01-13 LAB — PROTIME-INR
INR: 1.2 (ref 0.8–1.2)
Prothrombin Time: 14.7 seconds (ref 11.4–15.2)

## 2019-01-13 LAB — CDS SEROLOGY

## 2019-01-13 MED ORDER — GABAPENTIN 100 MG PO CAPS
200.0000 mg | ORAL_CAPSULE | Freq: Two times a day (BID) | ORAL | Status: DC
  Administered 2019-01-14 – 2019-01-19 (×10): 200 mg via ORAL
  Filled 2019-01-13 (×11): qty 2

## 2019-01-13 MED ORDER — DOCUSATE SODIUM 100 MG PO CAPS
100.0000 mg | ORAL_CAPSULE | Freq: Two times a day (BID) | ORAL | Status: DC
  Administered 2019-01-14 – 2019-01-19 (×10): 100 mg via ORAL
  Filled 2019-01-13 (×10): qty 1

## 2019-01-13 MED ORDER — METHOCARBAMOL 500 MG PO TABS
500.0000 mg | ORAL_TABLET | Freq: Three times a day (TID) | ORAL | Status: DC | PRN
  Administered 2019-01-14: 06:00:00 500 mg via ORAL
  Filled 2019-01-13: qty 1

## 2019-01-13 MED ORDER — MORPHINE SULFATE (PF) 2 MG/ML IV SOLN
1.0000 mg | INTRAVENOUS | Status: DC | PRN
  Administered 2019-01-13 – 2019-01-14 (×7): 1 mg via INTRAVENOUS
  Filled 2019-01-13 (×7): qty 1

## 2019-01-13 MED ORDER — PANTOPRAZOLE SODIUM 40 MG IV SOLR
40.0000 mg | Freq: Every day | INTRAVENOUS | Status: DC
  Administered 2019-01-14: 40 mg via INTRAVENOUS
  Filled 2019-01-13: qty 40

## 2019-01-13 MED ORDER — METOPROLOL TARTRATE 12.5 MG HALF TABLET
12.5000 mg | ORAL_TABLET | Freq: Two times a day (BID) | ORAL | Status: DC
  Administered 2019-01-14 – 2019-01-19 (×10): 12.5 mg via ORAL
  Filled 2019-01-13 (×10): qty 1

## 2019-01-13 MED ORDER — PANTOPRAZOLE SODIUM 40 MG PO TBEC
40.0000 mg | DELAYED_RELEASE_TABLET | Freq: Every day | ORAL | Status: DC
  Administered 2019-01-15 – 2019-01-19 (×5): 40 mg via ORAL
  Filled 2019-01-13 (×5): qty 1

## 2019-01-13 MED ORDER — OXYCODONE HCL 5 MG PO TABS
5.0000 mg | ORAL_TABLET | ORAL | Status: DC | PRN
  Administered 2019-01-14: 06:00:00 5 mg via ORAL
  Filled 2019-01-13: qty 1

## 2019-01-13 MED ORDER — HYDROMORPHONE HCL 1 MG/ML IJ SOLN
0.5000 mg | INTRAMUSCULAR | Status: DC | PRN
  Administered 2019-01-13 – 2019-01-14 (×4): 0.5 mg via INTRAVENOUS
  Filled 2019-01-13 (×4): qty 1

## 2019-01-13 MED ORDER — IOHEXOL 300 MG/ML  SOLN
100.0000 mL | Freq: Once | INTRAMUSCULAR | Status: AC | PRN
  Administered 2019-01-13: 100 mL via INTRAVENOUS

## 2019-01-13 MED ORDER — GADOBUTROL 1 MMOL/ML IV SOLN
8.0000 mL | Freq: Once | INTRAVENOUS | Status: AC | PRN
  Administered 2019-01-13: 8 mL via INTRAVENOUS

## 2019-01-13 MED ORDER — MORPHINE SULFATE (PF) 4 MG/ML IV SOLN
4.0000 mg | Freq: Once | INTRAVENOUS | Status: AC
  Administered 2019-01-13: 21:00:00 4 mg via INTRAVENOUS
  Filled 2019-01-13: qty 1

## 2019-01-13 MED ORDER — ONDANSETRON 4 MG PO TBDP
4.0000 mg | ORAL_TABLET | Freq: Four times a day (QID) | ORAL | Status: DC | PRN
  Administered 2019-01-16 – 2019-01-17 (×2): 4 mg via ORAL
  Filled 2019-01-13 (×2): qty 1

## 2019-01-13 MED ORDER — ONDANSETRON HCL 4 MG/2ML IJ SOLN
4.0000 mg | Freq: Four times a day (QID) | INTRAMUSCULAR | Status: DC | PRN
  Administered 2019-01-14 – 2019-01-18 (×5): 4 mg via INTRAVENOUS
  Filled 2019-01-13 (×5): qty 2

## 2019-01-13 NOTE — ED Notes (Signed)
Senaca Berardo [id number] wife of patient looking for an update

## 2019-01-13 NOTE — ED Notes (Signed)
zofran 4mg  IV given for N/V

## 2019-01-13 NOTE — Consult Note (Signed)
Chief Complaint   Chief Complaint  Patient presents with   level 2    HPI   Consult requested by: Trauma Reason for consult: cervical hematoma, T2 and T3 SP fractures, L1 inferior vertebral body fracture, L1-4 right TP fractures  HPI: Carlos Rowland is a 68 y.o. male who presented to ED as level 2 trauma after falling off a ladder, unknown height, while cutting down limbs of a tree. Immediately after the fall he developed severe LBP and side pain. He underwent work up by EDP and was found to have multiple injuries including ?cervical hematoma, T2 and T3 SP fractures, L1 inferior vertebral body fracture, L1-4 right TP fractures. NSY consultation was requested. He complains mostly of midline LBP and right flank pain. He denies neck pain, N/T in extremities, weakness in extremities. Denies blood thinning agents which exception of 81mg  ASA. 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  There are no active problems to display for this patient.   PMH: No past medical history on file.  PSH: (Not in a hospital admission)   SH: Social History   Tobacco Use   Smoking status: Not on file  Substance Use Topics   Alcohol use: Not on file   Drug use: Not on file    MEDS: Prior to Admission medications   Not on File    ALLERGY: No Known Allergies  Social History   Tobacco Use   Smoking status: Not on file  Substance Use Topics   Alcohol use: Not on file     No family history on file.   ROS   Review of Systems  Constitutional: Negative.   HENT: Negative.   Eyes: Negative for blurred vision, double vision and photophobia.  Respiratory: Negative.   Cardiovascular: Negative.   Gastrointestinal: Negative for nausea and vomiting.  Genitourinary: Negative.   Musculoskeletal: Positive for back pain, falls, joint pain and myalgias. Negative for neck pain.  Skin: Negative.   Neurological: Negative for dizziness, tingling, tremors, sensory  change, speech change, focal weakness, seizures, loss of consciousness, weakness and headaches.    Exam   Vitals:   01/13/19 2015 01/13/19 2030  BP: 135/82 (!) 110/56  Pulse: 79 78  Resp: 20 (!) 21  Temp:    SpO2: 100% 100%   General appearance: elderly male, multiple abrasions/bruises/skin tears GCS: 15 Eyes: No scleral injection Cardiovascular: Regular rate and rhythm without murmurs, rubs, gallops. No edema or variciosities. Distal pulses normal. Pulmonary: Effort normal, non-labored breathing Musculoskeletal:     Muscle tone upper extremities: Normal    Muscle tone lower extremities: Normal    Motor exam: Upper Extremities Deltoid Bicep Tricep Grip  Right 5/5 5/5 5/5 5/5  Left 5/5 5/5 5/5 5/5   Lower Extremity IP Quad PF DF EHL  Right 4+/5 5/5 5/5 5/5 5/5  Left 4+/5 5/5 5/5 5/5 5/5   Neurological Mental Status:    - Patient is awake, alert, oriented to person, place, month, year, and situation    - Patient is able to give a clear and coherent history.    - No signs of aphasia or neglect Cranial Nerves    - II: Visual Fields are full. PERRL    - III/IV/VI: EOMI without ptosis or diploplia.     - V: Facial sensation is grossly normal    - VII: Facial movement is symmetric.     - VIII: hearing is intact to voice    - X:  Uvula elevates symmetrically    - XI: Shoulder shrug is symmetric.    - XII: tongue is midline without atrophy or fasciculations.  Sensory: Sensation grossly intact to LT Deep Tendon Reflexes    - 2+ and symmetric in the biceps and patellae.  Plantars   - Toes are downgoing bilaterally.  Cerebellar    - FNF and HKS are intact bilaterally   Results - Imaging/Labs   Results for orders placed or performed during the hospital encounter of 01/13/19 (from the past 48 hour(s))  CDS serology     Status: None   Collection Time: 01/13/19  6:28 PM  Result Value Ref Range   CDS serology specimen      SPECIMEN WILL BE HELD FOR 14 DAYS IF TESTING IS  REQUIRED    Comment: Performed at [facility], [place] [address] [place], [place] [address]  Comprehensive metabolic panel     Status: Abnormal   Collection Time: 01/13/19  6:28 PM  Result Value Ref Range   Sodium 141 135 - 145 mmol/L   Potassium 4.3 3.5 - 5.1 mmol/L   Chloride 107 98 - 111 mmol/L   CO2 19 (L) 22 - 32 mmol/L   Glucose, Bld 113 (H) 70 - 99 mg/dL   BUN 25 (H) 8 - 23 mg/dL   Creatinine, Ser 1.81 (H) 0.61 - 1.24 mg/dL   Calcium 9.6 8.9 - 10.3 mg/dL   Total Protein 6.5 6.5 - 8.1 g/dL   Albumin 3.9 3.5 - 5.0 g/dL   AST 76 (H) 15 - 41 U/L   ALT 54 (H) 0 - 44 U/L   Alkaline Phosphatase 65 38 - 126 U/L   Total Bilirubin 1.1 0.3 - 1.2 mg/dL   GFR calc non Af Amer 38 (L) >60 mL/min   GFR calc Af Amer 44 (L) >60 mL/min   Anion gap 15 5 - 15    Comment: Performed at [facility] [address] [place], [place] [address]  CBC     Status: Abnormal   Collection Time: 01/13/19  6:28 PM  Result Value Ref Range   WBC 18.0 (H) 4.0 - 10.5 K/uL   RBC 4.60 4.22 - 5.81 MIL/uL   Hemoglobin 14.0 13.0 - 17.0 g/dL   HCT 42.7 39.0 - 52.0 %   MCV 92.8 80.0 - 100.0 fL   MCH 30.4 26.0 - 34.0 pg   MCHC 32.8 30.0 - 36.0 g/dL   RDW 12.9 11.5 - 15.5 %   Platelets 228 150 - 400 K/uL   nRBC 0.0 0.0 - 0.2 %    Comment: Performed at [facility], [place] [address], [place], [place] [address]  Ethanol     Status: None   Collection Time: 01/13/19  6:28 PM  Result Value Ref Range   Alcohol, Ethyl (B) <10 <10 mg/dL    Comment: (NOTE) Lowest detectable limit for serum alcohol is 10 mg/dL. For medical purposes only. Performed at [facility], [place] [address], [place], [place] [address]   Lactic acid, plasma     Status: Abnormal   Collection Time: 01/13/19  6:28 PM  Result Value Ref Range   Lactic Acid, Venous 2.7 (HH) 0.5 - 1.9 mmol/L    Comment: CRITICAL RESULT CALLED TO, READ BACK BY AND VERIFIED WITHJeanice Lim 1910 01/13/2019 WBOND Performed at [facility], [place] [address], [place], [place] [address]   Protime-INR     Status: None  Collection Time: 01/13/19  6:28 PM  Result Value Ref Range   Prothrombin Time 14.7 11.4 - 15.2 seconds   INR 1.2 0.8 - 1.2    Comment: (NOTE) INR goal varies based on device and disease states. Performed at [facility], [place] [address] [place], [place] [address]   I-stat chem 8, ED     Status: Abnormal   Collection Time: 01/13/19  6:28 PM  Result Value Ref Range   Sodium 142 135 - 145 mmol/L   Potassium 4.2 3.5 - 5.1 mmol/L   Chloride 109 98 - 111 mmol/L   BUN 24 (H) 8 - 23 mg/dL   Creatinine, Ser 1.50 (H) 0.61 - 1.24 mg/dL   Glucose, Bld 104 (H) 70 - 99 mg/dL   Calcium, Ion 1.18 1.15 - 1.40 mmol/L   TCO2 18 (L) 22 - 32 mmol/L   Hemoglobin 14.3 13.0 - 17.0 g/dL   HCT 42.0 39.0 - 52.0 %  Sample to Blood Bank     Status: None   Collection Time: 01/13/19  6:29 PM  Result Value Ref Range   Blood Bank Specimen SAMPLE AVAILABLE FOR TESTING    Sample Expiration      01/14/2019,2359 Performed at [facility], [place] [address] [place], [place] [address]     Ct Head Wo Contrast  Result Date: 01/13/2019 CLINICAL DATA:  [place] off a ladder. EXAM: CT HEAD WITHOUT CONTRAST CT CERVICAL SPINE WITHOUT CONTRAST TECHNIQUE: Multidetector CT imaging of the head and cervical spine was performed following the standard protocol without intravenous contrast. Multiplanar CT image reconstructions of the cervical spine were also generated. COMPARISON:  CT head dated Jul 22, 2018. FINDINGS: CT HEAD FINDINGS Brain: No evidence of acute infarction, hemorrhage, hydrocephalus, extra-axial collection or mass lesion/mass effect. Vascular: No hyperdense vessel or unexpected calcification. Skull: Normal. Negative for fracture or focal lesion. Sinuses/Orbits: No acute finding. Mild ethmoid air cell mucosal thickening. Other: None. CT CERVICAL SPINE FINDINGS Alignment: No traumatic malalignment. Trace retrolisthesis at C5-C6. Skull  base and vertebrae: No acute fracture in the cervical spine. Acute minimally displaced fractures of the T2 and T3 spinous processes. No primary bone lesion or focal pathologic process. 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 Soft tissues and spinal canal: No prevertebral fluid or swelling. Suspected long segment extra-axial hematoma along the anterior aspect of the cervical cord extending from C2-C3 to at least C6-C7 with narrowing of the thecal sac (series 5, image 28). Disc levels: Moderate to severe disc height loss and uncovertebral hypertrophy at C6-C7. Upper chest: Probable pulmonary contusion in the left lung apex. Paraseptal emphysema. Other: None. IMPRESSION: 1. Suspected long segment extra-axial hematoma along the anterior aspect of the cervical cord extending from C2-C3 to the C6-C7 with narrowing of the thecal sac. Hematoma may be subdural in location given thin epidural fat visualized anterior to the hematoma. MRI of the cervical spine recommended for further evaluation. 2. No acute cervical spine fracture. Acute fractures of the T2 and T3 spinous processes. 3. Probable pulmonary contusion in the left lung apex. 4.  No acute intracranial abnormality. Critical Value/emergent results were called by telephone at the time of interpretation on 01/13/2019 at 8:00 pm to [place] , who verbally acknowledged these results. Electronically Signed   By: Titus Dubin M.D.   On: 01/13/2019 20:04   Ct Chest W Contrast  Result Date: 01/13/2019 CLINICAL DATA:  Fall off ladder.  Abdominal trauma. EXAM: CT CHEST, ABDOMEN, AND PELVIS WITH  CONTRAST TECHNIQUE: Multidetector CT imaging of the chest, abdomen and pelvis was performed following the standard protocol during bolus administration of intravenous contrast. CONTRAST:  162mL OMNIPAQUE IOHEXOL 300 MG/ML  SOLN COMPARISON:  None. FINDINGS: CT CHEST FINDINGS Cardiovascular: Prior CABG. Heart is normal size. Aorta is normal caliber. No  evidence of aortic dissection or injury. Mediastinum/Nodes: No mediastinal, hilar, or axillary adenopathy. Trachea and esophagus are unremarkable. Thyroid unremarkable. No mediastinal hematoma. Lungs/Pleura: Moderate centrilobular and paraseptal emphysema. Dependent atelectasis. No confluent opacities. No pneumothorax. Musculoskeletal: Posterior right 11th and 12th rib fractures. Multiple old healed left rib fractures. There appear to be acute rib fractures on the left at the lateral left 5th and 6th ribs. CT ABDOMEN PELVIS FINDINGS Hepatobiliary: No focal hepatic abnormality. Gallbladder unremarkable. Pancreas: No focal abnormality or ductal dilatation. Spleen: No focal abnormality.  Normal size. Adrenals/Urinary Tract: No adrenal abnormality. No focal renal abnormality. No stones or hydronephrosis. Urinary bladder is unremarkable. Stomach/Bowel: Stomach, large and small bowel grossly unremarkable. Normal appendix. Vascular/Lymphatic: Aortic atherosclerosis. No enlarged abdominal or pelvic lymph nodes. Reproductive: No visible focal abnormality. Other: There is right posterior chest wall/abdominal wall and retroperitoneal hematoma. Hematoma noted around the right psoas muscle and iliopsoas muscle, posterior to the right kidney. Musculoskeletal: Fractures through the right transverse processes of L1 through L4. Acute fracture through the inferior aspect of the L1 vertebral body. Mild compression through the inferior endplate of L4 is age indeterminate. IMPRESSION: Fractures through the posterior right 11th and 12th ribs. Fractures through the right L1 through L4 transverse processes and the inferior L1 vertebral body. A mix of old and new fractures in the left ribs. No pneumothorax. Emphysema. Right retroperitoneal and flank hematoma. No evidence of solid organ injury. Aortic atherosclerosis. These results were called by telephone at the time of interpretation on 01/13/2019 at 8:24 pm to provider [facility] , who  verbally acknowledged these results. Electronically Signed   By: Rolm Baptise M.D.   On: 01/13/2019 20:24   Ct Cervical Spine Wo Contrast  Result Date: 01/13/2019 CLINICAL DATA:  [place] off a ladder. EXAM: CT HEAD WITHOUT CONTRAST CT CERVICAL SPINE WITHOUT CONTRAST TECHNIQUE: Multidetector CT imaging of the head and cervical spine was performed following the standard protocol without intravenous contrast. Multiplanar CT image reconstructions of the cervical spine were also generated. COMPARISON:  CT head dated Jul 22, 2018. FINDINGS: CT HEAD FINDINGS Brain: No evidence of acute infarction, hemorrhage, hydrocephalus, extra-axial collection or mass lesion/mass effect. Vascular: No hyperdense vessel or unexpected calcification. Skull: Normal. Negative for fracture or focal lesion. Sinuses/Orbits: No acute finding. Mild ethmoid air cell mucosal thickening. Other: None. CT CERVICAL SPINE FINDINGS Alignment: No traumatic malalignment. Trace retrolisthesis at C5-C6. Skull base and vertebrae: No acute fracture in the cervical spine. Acute minimally displaced fractures of the T2 and T3 spinous processes. No primary bone lesion or focal pathologic process. 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 Soft tissues and spinal canal: No prevertebral fluid or swelling. Suspected long segment extra-axial hematoma along the anterior aspect of the cervical cord extending from C2-C3 to at least C6-C7 with narrowing of the thecal sac (series 5, image 28). Disc levels: Moderate to severe disc height loss and uncovertebral hypertrophy at C6-C7. Upper chest: Probable pulmonary contusion in the left lung apex. Paraseptal emphysema. Other: None. IMPRESSION: 1. Suspected long segment extra-axial hematoma along the anterior aspect of the cervical cord extending from C2-C3 to the C6-C7 with narrowing of the thecal sac. Hematoma may be subdural in location  given thin epidural fat visualized anterior to the hematoma. MRI of the  cervical spine recommended for further evaluation. 2. No acute cervical spine fracture. Acute fractures of the T2 and T3 spinous processes. 3. Probable pulmonary contusion in the left lung apex. 4.  No acute intracranial abnormality. Critical Value/emergent results were called by telephone at the time of interpretation on 01/13/2019 at 8:00 pm to [place] , who verbally acknowledged these results. Electronically Signed   By: Titus Dubin M.D.   On: 01/13/2019 20:04   Ct Abdomen Pelvis W Contrast  Result Date: 01/13/2019 CLINICAL DATA:  Fall off ladder.  Abdominal trauma. EXAM: CT CHEST, ABDOMEN, AND PELVIS WITH CONTRAST TECHNIQUE: Multidetector CT imaging of the chest, abdomen and pelvis was performed following the standard protocol during bolus administration of intravenous contrast. CONTRAST:  14mL OMNIPAQUE IOHEXOL 300 MG/ML  SOLN COMPARISON:  None. FINDINGS: CT CHEST FINDINGS Cardiovascular: Prior CABG. Heart is normal size. Aorta is normal caliber. No evidence of aortic dissection or injury. Mediastinum/Nodes: No mediastinal, hilar, or axillary adenopathy. Trachea and esophagus are unremarkable. Thyroid unremarkable. No mediastinal hematoma. Lungs/Pleura: Moderate centrilobular and paraseptal emphysema. Dependent atelectasis. No confluent opacities. No pneumothorax. Musculoskeletal: Posterior right 11th and 12th rib fractures. Multiple old healed left rib fractures. There appear to be acute rib fractures on the left at the lateral left 5th and 6th ribs. CT ABDOMEN PELVIS FINDINGS Hepatobiliary: No focal hepatic abnormality. Gallbladder unremarkable. Pancreas: No focal abnormality or ductal dilatation. Spleen: No focal abnormality.  Normal size. Adrenals/Urinary Tract: No adrenal abnormality. No focal renal abnormality. No stones or hydronephrosis. Urinary bladder is unremarkable. Stomach/Bowel: Stomach, large and small bowel grossly unremarkable. Normal appendix. Vascular/Lymphatic: Aortic  atherosclerosis. No enlarged abdominal or pelvic lymph nodes. Reproductive: No visible focal abnormality. Other: There is right posterior chest wall/abdominal wall and retroperitoneal hematoma. Hematoma noted around the right psoas muscle and iliopsoas muscle, posterior to the right kidney. Musculoskeletal: Fractures through the right transverse processes of L1 through L4. Acute fracture through the inferior aspect of the L1 vertebral body. Mild compression through the inferior endplate of L4 is age indeterminate. IMPRESSION: Fractures through the posterior right 11th and 12th ribs. Fractures through the right L1 through L4 transverse processes and the inferior L1 vertebral body. A mix of old and new fractures in the left ribs. No pneumothorax. Emphysema. Right retroperitoneal and flank hematoma. No evidence of solid organ injury. Aortic atherosclerosis. These results were called by telephone at the time of interpretation on 01/13/2019 at 8:24 pm to provider [facility] , who verbally acknowledged these results. Electronically Signed   By: Rolm Baptise M.D.   On: 01/13/2019 20:24   Dg Pelvis Portable  Result Date: 01/13/2019 CLINICAL DATA:  Pt paged as Level 2 trauma for fall, possible SOB and CP. Pt unable to give hx. Best obtainable imaging due to traumatrauma EXAM: PORTABLE PELVIS 1-2 VIEWS COMPARISON:  None. FINDINGS: No evidence for acute fracture or subluxation. The RIGHT iliac wing and RIGHT greater trochanter are excluded. Surgical clips overlie the LEFT ischium. Degenerative changes are seen in the LOWER lumbar spine. Visualized bowel gas pattern is nonobstructive. IMPRESSION: No evidence for acute abnormality. Electronically Signed   By: Nolon Nations M.D.   On: 01/13/2019 19:35   Dg Chest Port 1 View  Result Date: 01/13/2019 CLINICAL DATA:  Trauma, fall and shortness of breath. EXAM: PORTABLE CHEST 1 VIEW COMPARISON:  11/01/2018 and prior radiographs FINDINGS: Cardiomediastinal silhouette is  unchanged with evidence of CABG noted. There is  no evidence of focal airspace disease, pulmonary edema, suspicious pulmonary nodule/mass, pleural effusion, or pneumothorax. No acute bony abnormalities are identified. Multiple remote LEFT rib fractures are unchanged. IMPRESSION: No evidence of acute cardiopulmonary disease. Electronically Signed   By: Margarette Canada M.D.   On: 01/13/2019 19:36    Impression/Plan   68 y.o. male with multiple spinal fractures, cervical hematoma, rib fractures, pulm contusion, flank hematoma after falling off a ladder. He is neurologically intact. He denies radicular symptoms. He does not require any emergent neurosurgical intervention at present. He is admitted under trauma.  R L1-L4 TP fractures, L1 inferior endplate fracture, L4 compression fracture (age indeterminate) - tx consveratively - TLSO when upright and OOB - pain control - PT/OT  T2 and T3 SP fractures - pain control, no bracing needed - as above  ?Cervical extra-axial hematoma from C2-3 to C6-7 - Neurologically intact. MRI C spine for further characterization. Do not suspect this is going to require intervention. - q 2 neuro checks, report any change - hold ASA  Ferne Reus, PA-C [facility]

## 2019-01-13 NOTE — ED Notes (Signed)
Cleaned pt's wounds and applied dry dressings.

## 2019-01-13 NOTE — ED Notes (Signed)
To MRI

## 2019-01-13 NOTE — H&P (Addendum)
History   Carlos Rowland is an 68 y.o. male.   Chief Complaint:  Chief Complaint  Patient presents with   level 15    HPI 68 year old male brought in as a level 2 trauma after falling off a ladder just prior to arrival.  He denies loss consciousness.  He states he was trimming some tree branches.  He complains of discomfort on his right posterior chest and right flank.  He complains of some right lower leg pain.  He denies any neck pain.  He denies any other abdominal pain.  He denies any other extremity pain.  He states that he does have some chronic weakness in his handgrips bilaterally.  He has prior history of falling with a rib fracture.  He denies any numbness or tingling in his extremities.  He denies any vision changes.  He denies any shortness of breath.  He apparently was requiring a nonrebreather to maintain his oxygen saturation when I was called for consultation but now he is on a nasal cannula with excellent vital signs  He is not on a blood thinner other than a baby aspirin  He has some chronic back pain  He has had some behavioral changes and concerns over the past year.  I reviewed his outside medical record.  He has been evaluated by a neurologist as well as his primary care physician.  Wife manages his meds and now manages household finances  Past medical history Hypertension Mild cognitive impairment History of CABG 1. Coronary artery disease involving autologous artery coronary bypass graft without angina pectoris  2. Essential hypertension  3. PVD (peripheral vascular disease) ([place])  4. Small vessel disease ([place])  5. Primary osteoarthritis involving multiple joints  6. Anxiety  7. Degenerative lumbar spinal stenosis  8. LFTs abnormal  9. Malaise and fatigue  10. Memory change  11. Mixed hyperlipidemia  12. Primary insomnia  13. Vitamin B12 deficiency    Past Surgical History:  Procedure Laterality Date   HERNIA REPAIR   skin cancer surgery   Triple  bypass surgery   Family History  Problem Relation Age of Onset   Dementia Mother   Headache Mother   Coronary artery disease Father   Hypertension Father   Cancer Father   Parkinsonism Neg Hx   Seizures Neg Hx   Stroke Neg Hx   Diabetes Neg Hx   Social History   Socioeconomic History   Marital status: Married  Tobacco Use   Smoking status: Former Smoker  Packs/day: 0.00  Years: 20.00    Allergies  No Known Allergies  Home Medications  (Not in a hospital admission) MED LIST FROM HIS visit with his PCP a few months ago:  ASPIRIN 81 MG EC TABLET *ANTIPLATELET* Take 81 mg by mouth daily.  ESCITALOPRAM OXALATE (LEXAPRO) 20 MG TABLET TAKE 1 AND 1/2 TABLETS DAILY BY MOUTH  GABAPENTIN (NEURONTIN) 300 MG CAPSULE TAKE 1 TO 2 TABLETS BY MOUTH AT BEDTIME  METOPROLOL TARTRATE (LOPRESSOR) 25 MG TABLET Take half tablet bid  PANTOPRAZOLE (PROTONIX) 40 MG TABLET TAKE 1 TABLET BY MOUTH EVERY DAY  PROMETHAZINE (PHENERGAN) 25 MG TABLET TAKE 1 TABLET (25 MG TOTAL) BY MOUTH EVERY SIX (6) HOURS AS NEEDED FOR NAUSEA.  ROSUVASTATIN (CRESTOR) 40 MG TABLET TAKE 1 TABLET BY MOUTH EVERY DAY  TRAMADOL (ULTRAM) 50 MG TABLET TAKE 1 TABLET BY MOUTH TWICE A DAY AS NEEDED  VALSARTAN (DIOVAN) 40 MG TABLET Take 1 tablet (40 mg total) by mouth daily.  Trauma Course   Results for orders placed or performed during the hospital encounter of 01/13/19 (from the past 48 hour(s))  CDS serology     Status: None   Collection Time: 01/13/19  6:28 PM  Result Value Ref Range   CDS serology specimen      SPECIMEN WILL BE HELD FOR 14 DAYS IF TESTING IS REQUIRED    Comment: Performed at [facility], [place] [address] [place], [place] [address]  Comprehensive metabolic panel     Status: Abnormal   Collection Time: 01/13/19  6:28 PM  Result Value Ref Range   Sodium 141 135 - 145 mmol/L   Potassium 4.3 3.5 - 5.1 mmol/L   Chloride 107 98 - 111 mmol/L   CO2 19 (L) 22 - 32 mmol/L   Glucose, Bld 113  (H) 70 - 99 mg/dL   BUN 25 (H) 8 - 23 mg/dL   Creatinine, Ser 1.81 (H) 0.61 - 1.24 mg/dL   Calcium 9.6 8.9 - 10.3 mg/dL   Total Protein 6.5 6.5 - 8.1 g/dL   Albumin 3.9 3.5 - 5.0 g/dL   AST 76 (H) 15 - 41 U/L   ALT 54 (H) 0 - 44 U/L   Alkaline Phosphatase 65 38 - 126 U/L   Total Bilirubin 1.1 0.3 - 1.2 mg/dL   GFR calc non Af Amer 38 (L) >60 mL/min   GFR calc Af Amer 44 (L) >60 mL/min   Anion gap 15 5 - 15    Comment: Performed at [facility] [address] [place], [place] [address]  CBC     Status: Abnormal   Collection Time: 01/13/19  6:28 PM  Result Value Ref Range   WBC 18.0 (H) 4.0 - 10.5 K/uL   RBC 4.60 4.22 - 5.81 MIL/uL   Hemoglobin 14.0 13.0 - 17.0 g/dL   HCT 42.7 39.0 - 52.0 %   MCV 92.8 80.0 - 100.0 fL   MCH 30.4 26.0 - 34.0 pg   MCHC 32.8 30.0 - 36.0 g/dL   RDW 12.9 11.5 - 15.5 %   Platelets 228 150 - 400 K/uL   nRBC 0.0 0.0 - 0.2 %    Comment: Performed at [facility], [place] [address], [place], [place] [address]  Ethanol     Status: None   Collection Time: 01/13/19  6:28 PM  Result Value Ref Range   Alcohol, Ethyl (B) <10 <10 mg/dL    Comment: (NOTE) Lowest detectable limit for serum alcohol is 10 mg/dL. For medical purposes only. Performed at [facility], [place] [address] [place], [place] [address]   Lactic acid, plasma     Status: Abnormal   Collection Time: 01/13/19  6:28 PM  Result Value Ref Range   Lactic Acid, Venous 2.7 (HH) 0.5 - 1.9 mmol/L    Comment: CRITICAL RESULT CALLED TO, READ BACK BY AND VERIFIED WITHJeanice Lim 1910 01/13/2019 WBOND Performed at [facility], [place] [address] [place], [place] [address]   Protime-INR     Status: None   Collection Time: 01/13/19  6:28 PM  Result Value Ref Range   Prothrombin Time 14.7 11.4 - 15.2 seconds   INR 1.2 0.8 - 1.2    Comment: (NOTE) INR goal varies based on device and disease states. Performed at [facility], [place] [address] [place], [place] [address]     I-stat chem 8, ED     Status: Abnormal  Collection Time: 01/13/19  6:28 PM  Result Value Ref Range   Sodium 142 135 - 145 mmol/L   Potassium 4.2 3.5 - 5.1 mmol/L   Chloride 109 98 - 111 mmol/L   BUN 24 (H) 8 - 23 mg/dL   Creatinine, Ser 1.50 (H) 0.61 - 1.24 mg/dL   Glucose, Bld 104 (H) 70 - 99 mg/dL   Calcium, Ion 1.18 1.15 - 1.40 mmol/L   TCO2 18 (L) 22 - 32 mmol/L   Hemoglobin 14.3 13.0 - 17.0 g/dL   HCT 42.0 39.0 - 52.0 %  Sample to Blood Bank     Status: None   Collection Time: 01/13/19  6:29 PM  Result Value Ref Range   Blood Bank Specimen SAMPLE AVAILABLE FOR TESTING    Sample Expiration      01/14/2019,2359 Performed at [facility], [place] [address], [place], [place] [address]    Ct Head Wo Contrast  Result Date: 01/13/2019 CLINICAL DATA:  [place] off a ladder. EXAM: CT HEAD WITHOUT CONTRAST CT CERVICAL SPINE WITHOUT CONTRAST TECHNIQUE: Multidetector CT imaging of the head and cervical spine was performed following the standard protocol without intravenous contrast. Multiplanar CT image reconstructions of the cervical spine were also generated. COMPARISON:  CT head dated Jul 22, 2018. FINDINGS: CT HEAD FINDINGS Brain: No evidence of acute infarction, hemorrhage, hydrocephalus, extra-axial collection or mass lesion/mass effect. Vascular: No hyperdense vessel or unexpected calcification. Skull: Normal. Negative for fracture or focal lesion. Sinuses/Orbits: No acute finding. Mild ethmoid air cell mucosal thickening. Other: None. CT CERVICAL SPINE FINDINGS Alignment: No traumatic malalignment. Trace retrolisthesis at C5-C6. Skull base and vertebrae: No acute fracture in the cervical spine. Acute minimally displaced fractures of the T2 and T3 spinous processes. No primary bone lesion or focal pathologic process. 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 Soft tissues and spinal canal: No prevertebral fluid or swelling. Suspected long segment extra-axial hematoma along the  anterior aspect of the cervical cord extending from C2-C3 to at least C6-C7 with narrowing of the thecal sac (series 5, image 28). Disc levels: Moderate to severe disc height loss and uncovertebral hypertrophy at C6-C7. Upper chest: Probable pulmonary contusion in the left lung apex. Paraseptal emphysema. Other: None. IMPRESSION: 1. Suspected long segment extra-axial hematoma along the anterior aspect of the cervical cord extending from C2-C3 to the C6-C7 with narrowing of the thecal sac. Hematoma may be subdural in location given thin epidural fat visualized anterior to the hematoma. MRI of the cervical spine recommended for further evaluation. 2. No acute cervical spine fracture. Acute fractures of the T2 and T3 spinous processes. 3. Probable pulmonary contusion in the left lung apex. 4.  No acute intracranial abnormality. Critical Value/emergent results were called by telephone at the time of interpretation on 01/13/2019 at 8:00 pm to [place] , who verbally acknowledged these results. Electronically Signed   By: Titus Dubin M.D.   On: 01/13/2019 20:04   Ct Chest W Contrast  Result Date: 01/13/2019 CLINICAL DATA:  Fall off ladder.  Abdominal trauma. EXAM: CT CHEST, ABDOMEN, AND PELVIS WITH CONTRAST TECHNIQUE: Multidetector CT imaging of the chest, abdomen and pelvis was performed following the standard protocol during bolus administration of intravenous contrast. CONTRAST:  158mL OMNIPAQUE IOHEXOL 300 MG/ML  SOLN COMPARISON:  None. FINDINGS: CT CHEST FINDINGS Cardiovascular: Prior CABG. Heart is normal size. Aorta is normal caliber. No evidence of aortic dissection or injury. Mediastinum/Nodes: No mediastinal, hilar, or axillary adenopathy. Trachea and esophagus are unremarkable.  Thyroid unremarkable. No mediastinal hematoma. Lungs/Pleura: Moderate centrilobular and paraseptal emphysema. Dependent atelectasis. No confluent opacities. No pneumothorax. Musculoskeletal: Posterior right 11th and  12th rib fractures. Multiple old healed left rib fractures. There appear to be acute rib fractures on the left at the lateral left 5th and 6th ribs. CT ABDOMEN PELVIS FINDINGS Hepatobiliary: No focal hepatic abnormality. Gallbladder unremarkable. Pancreas: No focal abnormality or ductal dilatation. Spleen: No focal abnormality.  Normal size. Adrenals/Urinary Tract: No adrenal abnormality. No focal renal abnormality. No stones or hydronephrosis. Urinary bladder is unremarkable. Stomach/Bowel: Stomach, large and small bowel grossly unremarkable. Normal appendix. Vascular/Lymphatic: Aortic atherosclerosis. No enlarged abdominal or pelvic lymph nodes. Reproductive: No visible focal abnormality. Other: There is right posterior chest wall/abdominal wall and retroperitoneal hematoma. Hematoma noted around the right psoas muscle and iliopsoas muscle, posterior to the right kidney. Musculoskeletal: Fractures through the right transverse processes of L1 through L4. Acute fracture through the inferior aspect of the L1 vertebral body. Mild compression through the inferior endplate of L4 is age indeterminate. IMPRESSION: Fractures through the posterior right 11th and 12th ribs. Fractures through the right L1 through L4 transverse processes and the inferior L1 vertebral body. A mix of old and new fractures in the left ribs. No pneumothorax. Emphysema. Right retroperitoneal and flank hematoma. No evidence of solid organ injury. Aortic atherosclerosis. These results were called by telephone at the time of interpretation on 01/13/2019 at 8:24 pm to provider [facility] , who verbally acknowledged these results. Electronically Signed   By: Rolm Baptise M.D.   On: 01/13/2019 20:24   Ct Cervical Spine Wo Contrast  Result Date: 01/13/2019 CLINICAL DATA:  [place] off a ladder. EXAM: CT HEAD WITHOUT CONTRAST CT CERVICAL SPINE WITHOUT CONTRAST TECHNIQUE: Multidetector CT imaging of the head and cervical spine was performed following the  standard protocol without intravenous contrast. Multiplanar CT image reconstructions of the cervical spine were also generated. COMPARISON:  CT head dated Jul 22, 2018. FINDINGS: CT HEAD FINDINGS Brain: No evidence of acute infarction, hemorrhage, hydrocephalus, extra-axial collection or mass lesion/mass effect. Vascular: No hyperdense vessel or unexpected calcification. Skull: Normal. Negative for fracture or focal lesion. Sinuses/Orbits: No acute finding. Mild ethmoid air cell mucosal thickening. Other: None. CT CERVICAL SPINE FINDINGS Alignment: No traumatic malalignment. Trace retrolisthesis at C5-C6. Skull base and vertebrae: No acute fracture in the cervical spine. Acute minimally displaced fractures of the T2 and T3 spinous processes. No primary bone lesion or focal pathologic process. 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 Soft tissues and spinal canal: No prevertebral fluid or swelling. Suspected long segment extra-axial hematoma along the anterior aspect of the cervical cord extending from C2-C3 to at least C6-C7 with narrowing of the thecal sac (series 5, image 28). Disc levels: Moderate to severe disc height loss and uncovertebral hypertrophy at C6-C7. Upper chest: Probable pulmonary contusion in the left lung apex. Paraseptal emphysema. Other: None. IMPRESSION: 1. Suspected long segment extra-axial hematoma along the anterior aspect of the cervical cord extending from C2-C3 to the C6-C7 with narrowing of the thecal sac. Hematoma may be subdural in location given thin epidural fat visualized anterior to the hematoma. MRI of the cervical spine recommended for further evaluation. 2. No acute cervical spine fracture. Acute fractures of the T2 and T3 spinous processes. 3. Probable pulmonary contusion in the left lung apex. 4.  No acute intracranial abnormality. Critical Value/emergent results were called by telephone at the time of interpretation on 01/13/2019 at 8:00 pm to [place] ,  who verbally acknowledged these results. Electronically Signed   By: Titus Dubin M.D.   On: 01/13/2019 20:04   Ct Abdomen Pelvis W Contrast  Result Date: 01/13/2019 CLINICAL DATA:  Fall off ladder.  Abdominal trauma. EXAM: CT CHEST, ABDOMEN, AND PELVIS WITH CONTRAST TECHNIQUE: Multidetector CT imaging of the chest, abdomen and pelvis was performed following the standard protocol during bolus administration of intravenous contrast. CONTRAST:  166mL OMNIPAQUE IOHEXOL 300 MG/ML  SOLN COMPARISON:  None. FINDINGS: CT CHEST FINDINGS Cardiovascular: Prior CABG. Heart is normal size. Aorta is normal caliber. No evidence of aortic dissection or injury. Mediastinum/Nodes: No mediastinal, hilar, or axillary adenopathy. Trachea and esophagus are unremarkable. Thyroid unremarkable. No mediastinal hematoma. Lungs/Pleura: Moderate centrilobular and paraseptal emphysema. Dependent atelectasis. No confluent opacities. No pneumothorax. Musculoskeletal: Posterior right 11th and 12th rib fractures. Multiple old healed left rib fractures. There appear to be acute rib fractures on the left at the lateral left 5th and 6th ribs. CT ABDOMEN PELVIS FINDINGS Hepatobiliary: No focal hepatic abnormality. Gallbladder unremarkable. Pancreas: No focal abnormality or ductal dilatation. Spleen: No focal abnormality.  Normal size. Adrenals/Urinary Tract: No adrenal abnormality. No focal renal abnormality. No stones or hydronephrosis. Urinary bladder is unremarkable. Stomach/Bowel: Stomach, large and small bowel grossly unremarkable. Normal appendix. Vascular/Lymphatic: Aortic atherosclerosis. No enlarged abdominal or pelvic lymph nodes. Reproductive: No visible focal abnormality. Other: There is right posterior chest wall/abdominal wall and retroperitoneal hematoma. Hematoma noted around the right psoas muscle and iliopsoas muscle, posterior to the right kidney. Musculoskeletal: Fractures through the right transverse processes of L1  through L4. Acute fracture through the inferior aspect of the L1 vertebral body. Mild compression through the inferior endplate of L4 is age indeterminate. IMPRESSION: Fractures through the posterior right 11th and 12th ribs. Fractures through the right L1 through L4 transverse processes and the inferior L1 vertebral body. A mix of old and new fractures in the left ribs. No pneumothorax. Emphysema. Right retroperitoneal and flank hematoma. No evidence of solid organ injury. Aortic atherosclerosis. These results were called by telephone at the time of interpretation on 01/13/2019 at 8:24 pm to provider [facility] , who verbally acknowledged these results. Electronically Signed   By: Rolm Baptise M.D.   On: 01/13/2019 20:24   Dg Pelvis Portable  Result Date: 01/13/2019 CLINICAL DATA:  Pt paged as Level 2 trauma for fall, possible SOB and CP. Pt unable to give hx. Best obtainable imaging due to traumatrauma EXAM: PORTABLE PELVIS 1-2 VIEWS COMPARISON:  None. FINDINGS: No evidence for acute fracture or subluxation. The RIGHT iliac wing and RIGHT greater trochanter are excluded. Surgical clips overlie the LEFT ischium. Degenerative changes are seen in the LOWER lumbar spine. Visualized bowel gas pattern is nonobstructive. IMPRESSION: No evidence for acute abnormality. Electronically Signed   By: Nolon Nations M.D.   On: 01/13/2019 19:35   Dg Chest Port 1 View  Result Date: 01/13/2019 CLINICAL DATA:  Trauma, fall and shortness of breath. EXAM: PORTABLE CHEST 1 VIEW COMPARISON:  11/01/2018 and prior radiographs FINDINGS: Cardiomediastinal silhouette is unchanged with evidence of CABG noted. There is no evidence of focal airspace disease, pulmonary edema, suspicious pulmonary nodule/mass, pleural effusion, or pneumothorax. No acute bony abnormalities are identified. Multiple remote LEFT rib fractures are unchanged. IMPRESSION: No evidence of acute cardiopulmonary disease. Electronically Signed   By: Margarette Canada  M.D.   On: 01/13/2019 19:36    Review of Systems  All other systems reviewed and are negative.   Blood pressure (!) 103/59, pulse  77, temperature (S) 97.9 F (36.6 C), temperature source Axillary, resp. rate (!) 24, SpO2 96 %. Physical Exam  Vitals reviewed. Constitutional: He is oriented to person, place, and time. He appears well-developed and well-nourished. He is cooperative. No distress. Cervical collar and nasal cannula in place.  HENT:  Head: Normocephalic and atraumatic. Head is without raccoon's eyes, without Battle's sign, without abrasion, without contusion and without laceration.  Right Ear: Hearing, tympanic membrane, external ear and ear canal normal. No lacerations. No drainage or tenderness. No foreign bodies. Tympanic membrane is not perforated. No hemotympanum.  Left Ear: Hearing, tympanic membrane, external ear and ear canal normal. No lacerations. No drainage or tenderness. No foreign bodies. Tympanic membrane is not perforated. No hemotympanum.  Nose: Nose normal. No nose lacerations, sinus tenderness, nasal deformity or nasal septal hematoma. No epistaxis.  Mouth/Throat: Uvula is midline, oropharynx is clear and moist and mucous membranes are normal. No lacerations.  Eyes: Pupils are equal, round, and reactive to light. Conjunctivae, EOM and lids are normal. No scleral icterus.  Neck: Trachea normal. No JVD present. No spinous process tenderness and no muscular tenderness present. Carotid bruit is not present. No tracheal deviation present. No thyromegaly present.  +collar  Cardiovascular: Normal rate, regular rhythm, normal heart sounds, intact distal pulses and normal pulses.  Respiratory: Effort normal and breath sounds normal. No respiratory distress. He exhibits tenderness (rt posterior). He exhibits no bony tenderness, no laceration and no crepitus.  GI: Soft. Normal appearance. He exhibits no distension. Bowel sounds are decreased. There is no abdominal tenderness.  There is no rigidity, no rebound, no guarding and no CVA tenderness.  Right flank tenderness,no obvious bruising  Musculoskeletal: Normal range of motion.        General: No tenderness or edema.  Lymphadenopathy:    He has no cervical adenopathy.  Neurological: He is alert and oriented to person, place, and time. He has normal strength. No cranial nerve deficit or sensory deficit. GCS eye subscore is 4. GCS verbal subscore is 5. GCS motor subscore is 6.  Weak grip b/l  Skin: Skin is warm and dry. Abrasion, bruising and ecchymosis noted. He is not diaphoretic.     RUE - significant ecchymosis, Pt has hematoma and skin tear to right posterior forearm. Pt as skin tear to right posterior hand, posterior 2nd and 4th digit of right hand. Pt has deep wound to right lower outer leg. 2+ radial pulses bilat, 2+ pedal pulses bilat.   Psychiatric: He has a normal mood and affect. His speech is normal and behavior is normal. He is not agitated and not aggressive.  ?correct recall of events       Assessment/Plan S/p fall Right rib fx 11, 12 L rib fx 5,6 LUL pulmonary contusion Rt flank/retroperitoneal hematoma Rt TP fx L1-L4 L1 Vertebral body fx T2,3 spinous process fx Extra-axial hematoma along C2-C7 Elevated Creatinine  Possible dehydration RLE skin abrasion with exposed muscle Scattered abrasions/contusions HTN Recent behavorial changes/cognitive changes past year  Admit to progressive care unit Neurosurgical consultation for extra-axial hematoma/vertebral body fracture MRI of C-spine Pulmonary toilet Pain control IV fluid hydration Repeat labs in morning Will need PT and OT assessment once cleared by neurosurgery Maintain C-spine precautions as well as L-spine precautions for time being  I believe the patient may have some dehydration.  He has normal renal function when care everywhere is reviewed.  Given his elevated white blood cell count and elevated creatinine I think he has  some dehydration.  He has had some minor transaminase elevation in the past.  Will follow LFTs  Updated wife on phone  Leighton Ruff. Redmond Pulling, MD, FACS General, Bariatric, & Minimally Invasive Surgery [facility]   Greer Pickerel 01/13/2019, 9:23 PM   Procedures

## 2019-01-13 NOTE — ED Notes (Signed)
Pt has hematoma and skin tear to right posterior forearm. Pt as skin tear to right posterior hand, posterior 2nd and 4th digit of right hand. Pt has deep wound to right lower outer leg. 2+ radial pulses bilat, 2+ pedal pulses bilat.

## 2019-01-13 NOTE — ED Notes (Signed)
To CT scan

## 2019-01-13 NOTE — ED Provider Notes (Addendum)
Carlos Rowland Provider Note   CSN: [id number] Arrival date & time: 01/13/19  1820     History   Chief Complaint Chief Complaint  Patient presents with   level 2    HPI Carlos Rowland is a 68 y.o. male.     HPI   Patient presents as a level 2 trauma after falling off a ladder, arriving from the scene.  He did not have any LOC, did not hit his head, reportedly fell on the right side of his body while he was trimming some tree branches.  A tree branch hit his ladder and the ladder fell sideways causing him to fall.  He denies any neck pain, headaches, endorses extreme right chest wall pain with nausea.  He is able to move all 4 extremity is GCS 15, requiring oxygen via nonrebreather mask.  He denies any blood thinner use beyond a baby aspirin daily.  No past medical history on file.  Patient Active Problem List   Diagnosis Date Noted   Fall 01/13/2019    Home Medications    Prior to Admission medications   Not on File    Family History No family history on file.  Social History Social History   Tobacco Use   Smoking status: Not on file  Substance Use Topics   Alcohol use: Not on file   Drug use: Not on file     Allergies   Patient has no known allergies.   Review of Systems Review of Systems  Constitutional: Negative for chills and fever.  Eyes: Negative for visual disturbance.  Respiratory: Negative for cough and shortness of breath.   Cardiovascular: Positive for chest pain. Negative for palpitations.  Gastrointestinal: Positive for abdominal pain. Negative for nausea and vomiting.  Skin: Positive for wound. Negative for rash.  Neurological: Negative for syncope.  All other systems reviewed and are negative.    Physical Exam Updated Vital Signs BP (!) 103/59    Pulse 77    Temp (S) 97.9 F (36.6 C) (Axillary)    Resp (!) 24    SpO2 96%   Physical Exam Vitals signs and nursing note reviewed.    Constitutional:      Appearance: He is well-developed.  HENT:     Head: Normocephalic.  Eyes:     Conjunctiva/sclera: Conjunctivae normal.  Neck:     Musculoskeletal: Neck supple.     Comments: Cervical collar in place Cardiovascular:     Rate and Rhythm: Normal rate and regular rhythm.     Pulses: Normal pulses.     Comments: 2+ radial and DP pulses bilaterally Pulmonary:     Effort: Pulmonary effort is normal. No respiratory distress.     Breath sounds: Normal breath sounds.     Comments: Initially on 2 L nasal cannula, switched to nonrebreather due to hypoxia Abdominal:     General: There is no distension.     Palpations: Abdomen is soft.     Tenderness: There is no abdominal tenderness. There is no guarding or rebound.     Comments: Right flank hematoma present, without tenderness to palpation  Musculoskeletal:     Comments: No bony tenderness to palpation on all 4 extremities  Skin:    General: Skin is warm and dry.     Comments: Abrasion to the right temple, skin avulsion to the right lower leg, multiple abrasions to the right hand and right forearm.  Neurological:     Mental Status:  He is alert.      ED Treatments / Results  Labs (all labs ordered are listed, but only abnormal results are displayed) Labs Reviewed  COMPREHENSIVE METABOLIC PANEL - Abnormal; Notable for the following components:      Result Value   CO2 19 (*)    Glucose, Bld 113 (*)    BUN 25 (*)    Creatinine, Ser 1.81 (*)    AST 76 (*)    ALT 54 (*)    GFR calc non Af Amer 38 (*)    GFR calc Af Amer 44 (*)    All other components within normal limits  CBC - Abnormal; Notable for the following components:   WBC 18.0 (*)    All other components within normal limits  LACTIC ACID, PLASMA - Abnormal; Notable for the following components:   Lactic Acid, Venous 2.7 (*)    All other components within normal limits  I-STAT CHEM 8, ED - Abnormal; Notable for the following components:   BUN 24 (*)     Creatinine, Ser 1.50 (*)    Glucose, Bld 104 (*)    TCO2 18 (*)    All other components within normal limits  SARS CORONAVIRUS 2 (TAT 6-24 HRS)  CDS SEROLOGY  ETHANOL  PROTIME-INR  URINALYSIS, ROUTINE W REFLEX MICROSCOPIC  SAMPLE TO BLOOD BANK    EKG None  Radiology Ct Head Wo Contrast  Result Date: 01/13/2019 CLINICAL DATA:  [place] off a ladder. EXAM: CT HEAD WITHOUT CONTRAST CT CERVICAL SPINE WITHOUT CONTRAST TECHNIQUE: Multidetector CT imaging of the head and cervical spine was performed following the standard protocol without intravenous contrast. Multiplanar CT image reconstructions of the cervical spine were also generated. COMPARISON:  CT head dated Jul 22, 2018. FINDINGS: CT HEAD FINDINGS Brain: No evidence of acute infarction, hemorrhage, hydrocephalus, extra-axial collection or mass lesion/mass effect. Vascular: No hyperdense vessel or unexpected calcification. Skull: Normal. Negative for fracture or focal lesion. Sinuses/Orbits: No acute finding. Mild ethmoid air cell mucosal thickening. Other: None. CT CERVICAL SPINE FINDINGS Alignment: No traumatic malalignment. Trace retrolisthesis at C5-C6. Skull base and vertebrae: No acute fracture in the cervical spine. Acute minimally displaced fractures of the T2 and T3 spinous processes. No primary bone lesion or focal pathologic process. 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 Soft tissues and spinal canal: No prevertebral fluid or swelling. Suspected long segment extra-axial hematoma along the anterior aspect of the cervical cord extending from C2-C3 to at least C6-C7 with narrowing of the thecal sac (series 5, image 28). Disc levels: Moderate to severe disc height loss and uncovertebral hypertrophy at C6-C7. Upper chest: Probable pulmonary contusion in the left lung apex. Paraseptal emphysema. Other: None. IMPRESSION: 1. Suspected long segment extra-axial hematoma along the anterior aspect of the cervical cord extending from  C2-C3 to the C6-C7 with narrowing of the thecal sac. Hematoma may be subdural in location given thin epidural fat visualized anterior to the hematoma. MRI of the cervical spine recommended for further evaluation. 2. No acute cervical spine fracture. Acute fractures of the T2 and T3 spinous processes. 3. Probable pulmonary contusion in the left lung apex. 4.  No acute intracranial abnormality. Critical Value/emergent results were called by telephone at the time of interpretation on 01/13/2019 at 8:00 pm to [place] , who verbally acknowledged these results. Electronically Signed   By: Titus Dubin M.D.   On: 01/13/2019 20:04   Ct Chest W Contrast  Result Date: 01/13/2019 CLINICAL DATA:  Fall  off ladder.  Abdominal trauma. EXAM: CT CHEST, ABDOMEN, AND PELVIS WITH CONTRAST TECHNIQUE: Multidetector CT imaging of the chest, abdomen and pelvis was performed following the standard protocol during bolus administration of intravenous contrast. CONTRAST:  169mL OMNIPAQUE IOHEXOL 300 MG/ML  SOLN COMPARISON:  None. FINDINGS: CT CHEST FINDINGS Cardiovascular: Prior CABG. Heart is normal size. Aorta is normal caliber. No evidence of aortic dissection or injury. Mediastinum/Nodes: No mediastinal, hilar, or axillary adenopathy. Trachea and esophagus are unremarkable. Thyroid unremarkable. No mediastinal hematoma. Lungs/Pleura: Moderate centrilobular and paraseptal emphysema. Dependent atelectasis. No confluent opacities. No pneumothorax. Musculoskeletal: Posterior right 11th and 12th rib fractures. Multiple old healed left rib fractures. There appear to be acute rib fractures on the left at the lateral left 5th and 6th ribs. CT ABDOMEN PELVIS FINDINGS Hepatobiliary: No focal hepatic abnormality. Gallbladder unremarkable. Pancreas: No focal abnormality or ductal dilatation. Spleen: No focal abnormality.  Normal size. Adrenals/Urinary Tract: No adrenal abnormality. No focal renal abnormality. No stones or  hydronephrosis. Urinary bladder is unremarkable. Stomach/Bowel: Stomach, large and small bowel grossly unremarkable. Normal appendix. Vascular/Lymphatic: Aortic atherosclerosis. No enlarged abdominal or pelvic lymph nodes. Reproductive: No visible focal abnormality. Other: There is right posterior chest wall/abdominal wall and retroperitoneal hematoma. Hematoma noted around the right psoas muscle and iliopsoas muscle, posterior to the right kidney. Musculoskeletal: Fractures through the right transverse processes of L1 through L4. Acute fracture through the inferior aspect of the L1 vertebral body. Mild compression through the inferior endplate of L4 is age indeterminate. IMPRESSION: Fractures through the posterior right 11th and 12th ribs. Fractures through the right L1 through L4 transverse processes and the inferior L1 vertebral body. A mix of old and new fractures in the left ribs. No pneumothorax. Emphysema. Right retroperitoneal and flank hematoma. No evidence of solid organ injury. Aortic atherosclerosis. These results were called by telephone at the time of interpretation on 01/13/2019 at 8:24 pm to provider [facility] , who verbally acknowledged these results. Electronically Signed   By: Rolm Baptise M.D.   On: 01/13/2019 20:24   Ct Cervical Spine Wo Contrast  Result Date: 01/13/2019 CLINICAL DATA:  [place] off a ladder. EXAM: CT HEAD WITHOUT CONTRAST CT CERVICAL SPINE WITHOUT CONTRAST TECHNIQUE: Multidetector CT imaging of the head and cervical spine was performed following the standard protocol without intravenous contrast. Multiplanar CT image reconstructions of the cervical spine were also generated. COMPARISON:  CT head dated Jul 22, 2018. FINDINGS: CT HEAD FINDINGS Brain: No evidence of acute infarction, hemorrhage, hydrocephalus, extra-axial collection or mass lesion/mass effect. Vascular: No hyperdense vessel or unexpected calcification. Skull: Normal. Negative for fracture or focal lesion.  Sinuses/Orbits: No acute finding. Mild ethmoid air cell mucosal thickening. Other: None. CT CERVICAL SPINE FINDINGS Alignment: No traumatic malalignment. Trace retrolisthesis at C5-C6. Skull base and vertebrae: No acute fracture in the cervical spine. Acute minimally displaced fractures of the T2 and T3 spinous processes. No primary bone lesion or focal pathologic process. 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 Soft tissues and spinal canal: No prevertebral fluid or swelling. Suspected long segment extra-axial hematoma along the anterior aspect of the cervical cord extending from C2-C3 to at least C6-C7 with narrowing of the thecal sac (series 5, image 28). Disc levels: Moderate to severe disc height loss and uncovertebral hypertrophy at C6-C7. Upper chest: Probable pulmonary contusion in the left lung apex. Paraseptal emphysema. Other: None. IMPRESSION: 1. Suspected long segment extra-axial hematoma along the anterior aspect of the cervical cord extending from C2-C3 to the C6-C7  with narrowing of the thecal sac. Hematoma may be subdural in location given thin epidural fat visualized anterior to the hematoma. MRI of the cervical spine recommended for further evaluation. 2. No acute cervical spine fracture. Acute fractures of the T2 and T3 spinous processes. 3. Probable pulmonary contusion in the left lung apex. 4.  No acute intracranial abnormality. Critical Value/emergent results were called by telephone at the time of interpretation on 01/13/2019 at 8:00 pm to [place] , who verbally acknowledged these results. Electronically Signed   By: Titus Dubin M.D.   On: 01/13/2019 20:04   Ct Abdomen Pelvis W Contrast  Result Date: 01/13/2019 CLINICAL DATA:  Fall off ladder.  Abdominal trauma. EXAM: CT CHEST, ABDOMEN, AND PELVIS WITH CONTRAST TECHNIQUE: Multidetector CT imaging of the chest, abdomen and pelvis was performed following the standard protocol during bolus administration of  intravenous contrast. CONTRAST:  132mL OMNIPAQUE IOHEXOL 300 MG/ML  SOLN COMPARISON:  None. FINDINGS: CT CHEST FINDINGS Cardiovascular: Prior CABG. Heart is normal size. Aorta is normal caliber. No evidence of aortic dissection or injury. Mediastinum/Nodes: No mediastinal, hilar, or axillary adenopathy. Trachea and esophagus are unremarkable. Thyroid unremarkable. No mediastinal hematoma. Lungs/Pleura: Moderate centrilobular and paraseptal emphysema. Dependent atelectasis. No confluent opacities. No pneumothorax. Musculoskeletal: Posterior right 11th and 12th rib fractures. Multiple old healed left rib fractures. There appear to be acute rib fractures on the left at the lateral left 5th and 6th ribs. CT ABDOMEN PELVIS FINDINGS Hepatobiliary: No focal hepatic abnormality. Gallbladder unremarkable. Pancreas: No focal abnormality or ductal dilatation. Spleen: No focal abnormality.  Normal size. Adrenals/Urinary Tract: No adrenal abnormality. No focal renal abnormality. No stones or hydronephrosis. Urinary bladder is unremarkable. Stomach/Bowel: Stomach, large and small bowel grossly unremarkable. Normal appendix. Vascular/Lymphatic: Aortic atherosclerosis. No enlarged abdominal or pelvic lymph nodes. Reproductive: No visible focal abnormality. Other: There is right posterior chest wall/abdominal wall and retroperitoneal hematoma. Hematoma noted around the right psoas muscle and iliopsoas muscle, posterior to the right kidney. Musculoskeletal: Fractures through the right transverse processes of L1 through L4. Acute fracture through the inferior aspect of the L1 vertebral body. Mild compression through the inferior endplate of L4 is age indeterminate. IMPRESSION: Fractures through the posterior right 11th and 12th ribs. Fractures through the right L1 through L4 transverse processes and the inferior L1 vertebral body. A mix of old and new fractures in the left ribs. No pneumothorax. Emphysema. Right retroperitoneal and  flank hematoma. No evidence of solid organ injury. Aortic atherosclerosis. These results were called by telephone at the time of interpretation on 01/13/2019 at 8:24 pm to provider [facility] , who verbally acknowledged these results. Electronically Signed   By: Rolm Baptise M.D.   On: 01/13/2019 20:24   Dg Pelvis Portable  Result Date: 01/13/2019 CLINICAL DATA:  Pt paged as Level 2 trauma for fall, possible SOB and CP. Pt unable to give hx. Best obtainable imaging due to traumatrauma EXAM: PORTABLE PELVIS 1-2 VIEWS COMPARISON:  None. FINDINGS: No evidence for acute fracture or subluxation. The RIGHT iliac wing and RIGHT greater trochanter are excluded. Surgical clips overlie the LEFT ischium. Degenerative changes are seen in the LOWER lumbar spine. Visualized bowel gas pattern is nonobstructive. IMPRESSION: No evidence for acute abnormality. Electronically Signed   By: Nolon Nations M.D.   On: 01/13/2019 19:35   Dg Chest Port 1 View  Result Date: 01/13/2019 CLINICAL DATA:  Trauma, fall and shortness of breath. EXAM: PORTABLE CHEST 1 VIEW COMPARISON:  11/01/2018 and prior radiographs FINDINGS:  Cardiomediastinal silhouette is unchanged with evidence of CABG noted. There is no evidence of focal airspace disease, pulmonary edema, suspicious pulmonary nodule/mass, pleural effusion, or pneumothorax. No acute bony abnormalities are identified. Multiple remote LEFT rib fractures are unchanged. IMPRESSION: No evidence of acute cardiopulmonary disease. Electronically Signed   By: Margarette Canada M.D.   On: 01/13/2019 19:36    Procedures Procedures (including critical care time)  Medications Ordered in ED Medications  iohexol (OMNIPAQUE) 300 MG/ML solution 100 mL (100 mLs Intravenous Contrast Given 01/13/19 1914)  morphine 4 MG/ML injection 4 mg (4 mg Intravenous Given 01/13/19 2041)     Initial Impression / Assessment and Plan / ED Course  I have reviewed the triage vital signs and the nursing  notes.  Pertinent labs & imaging results that were available during my care of the patient were reviewed by me and considered in my medical decision making (see chart for details).       ANDRO GROSSER is a 68 y.o. male with a past medical history of hypertension, mild cognitive impairment, history of CABG who lives at home with his wife who takes care of him presents today after a fall from an unknown height from a ladder.  Imaging for full trauma scans as well as labs ordered.  Currently placed on nonrebreather for decreased oxygen saturations, but will attempt to wean to nasal cannula.  Labs showed a creatinine of 1.81, previous creatinine 1.5.  The rest of acute findings for left ear likely related to trauma and not sepsis.  Imaging showed multiple rib fractures both in the left and right sides, a vertebral fracture, TP fractures in the lumbar spine, possible hematoma of the cervical spine for which an MRI is ordered.  Patient has been successfully weaned to nasal cannula oxygen.  Pulmonary contusion present.  Trauma is consulted for admission, neurosurgery consulted in the ED.  Care of patient discussed with the supervising attending.  Final Clinical Impressions(s) / ED Diagnoses   Final diagnoses:  Fall from ladder, initial encounter  Closed fracture of multiple ribs of right side, initial encounter  Closed fracture of transverse process of lumbar vertebra, initial encounter ([place])  Compression fracture of lumbar vertebra, initial encounter, unspecified lumbar vertebral level ([place])  Multiple fractures of ribs, left side, initial encounter for closed fracture  Closed fracture of spinous process of thoracic vertebra, initial encounter [facility])  Subdural hematoma of neuraxis [facility])    ED Discharge Orders    None         Julianne Rice, MD 01/13/19 2225    Lajean Saver, MD 01/13/19 2307

## 2019-01-14 LAB — COMPREHENSIVE METABOLIC PANEL
ALT: 40 U/L (ref 0–44)
AST: 58 U/L — ABNORMAL HIGH (ref 15–41)
Albumin: 2.8 g/dL — ABNORMAL LOW (ref 3.5–5.0)
Alkaline Phosphatase: 45 U/L (ref 38–126)
Anion gap: 6 (ref 5–15)
BUN: 24 mg/dL — ABNORMAL HIGH (ref 8–23)
CO2: 17 mmol/L — ABNORMAL LOW (ref 22–32)
Calcium: 6.9 mg/dL — ABNORMAL LOW (ref 8.9–10.3)
Chloride: 118 mmol/L — ABNORMAL HIGH (ref 98–111)
Creatinine, Ser: 1.24 mg/dL (ref 0.61–1.24)
GFR calc Af Amer: >60 mL/min (ref >60)
GFR calc non Af Amer: >60 mL/min (ref >60)
Glucose, Bld: 96 mg/dL (ref 70–99)
Potassium: 7.4 mmol/L (ref 3.5–5.1)
Sodium: 141 mmol/L (ref 135–145)
Total Bilirubin: 0.6 mg/dL (ref 0.3–1.2)
Total Protein: 4.6 g/dL — ABNORMAL LOW (ref 6.5–8.1)

## 2019-01-14 LAB — URINALYSIS, ROUTINE W REFLEX MICROSCOPIC
Bilirubin Urine: NEGATIVE
Glucose, UA: NEGATIVE mg/dL
Ketones, ur: 20 mg/dL — AB
Leukocytes,Ua: NEGATIVE
Nitrite: NEGATIVE
Protein, ur: NEGATIVE mg/dL
Specific Gravity, Urine: 1.041 — ABNORMAL HIGH (ref 1.005–1.030)
pH: 5 (ref 5.0–8.0)

## 2019-01-14 LAB — CBC
HCT: 34.4 % — ABNORMAL LOW (ref 39.0–52.0)
Hemoglobin: 11.1 g/dL — ABNORMAL LOW (ref 13.0–17.0)
MCH: 30.1 pg (ref 26.0–34.0)
MCHC: 32.3 g/dL (ref 30.0–36.0)
MCV: 93.2 fL (ref 80.0–100.0)
Platelets: 151 10*3/uL (ref 150–400)
RBC: 3.69 MIL/uL — ABNORMAL LOW (ref 4.22–5.81)
RDW: 13.1 % (ref 11.5–15.5)
WBC: 9.1 10*3/uL (ref 4.0–10.5)
nRBC: 0 % (ref 0.0–0.2)

## 2019-01-14 LAB — POTASSIUM: Potassium: 5.3 mmol/L — ABNORMAL HIGH (ref 3.5–5.1)

## 2019-01-14 LAB — MRSA PCR SCREENING: MRSA by PCR: NEGATIVE

## 2019-01-14 LAB — SARS CORONAVIRUS 2 (TAT 6-24 HRS): SARS Coronavirus 2: NEGATIVE

## 2019-01-14 LAB — HIV ANTIBODY (ROUTINE TESTING W REFLEX): HIV Screen 4th Generation wRfx: NONREACTIVE

## 2019-01-14 MED ORDER — OXYCODONE HCL 5 MG PO TABS
5.0000 mg | ORAL_TABLET | ORAL | Status: DC | PRN
  Administered 2019-01-14: 23:00:00 5 mg via ORAL
  Administered 2019-01-15 – 2019-01-16 (×4): 10 mg via ORAL
  Filled 2019-01-14: qty 1
  Filled 2019-01-14 (×4): qty 2

## 2019-01-14 MED ORDER — HYDROMORPHONE HCL 1 MG/ML IJ SOLN
1.0000 mg | INTRAMUSCULAR | Status: DC | PRN
  Administered 2019-01-14 – 2019-01-16 (×8): 1 mg via INTRAVENOUS
  Filled 2019-01-14 (×8): qty 1

## 2019-01-14 MED ORDER — POLYETHYLENE GLYCOL 3350 17 G PO PACK
17.0000 g | PACK | Freq: Every day | ORAL | Status: DC
  Administered 2019-01-15 – 2019-01-19 (×4): 17 g via ORAL
  Filled 2019-01-14 (×5): qty 1

## 2019-01-14 MED ORDER — METHOCARBAMOL 1000 MG/10ML IJ SOLN
1000.0000 mg | Freq: Three times a day (TID) | INTRAVENOUS | Status: DC | PRN
  Filled 2019-01-14: qty 10

## 2019-01-14 MED ORDER — METHOCARBAMOL 500 MG PO TABS: 500.0000 mg | ORAL_TABLET | Freq: Three times a day (TID) | ORAL | Status: DC

## 2019-01-14 MED ORDER — ESCITALOPRAM OXALATE 20 MG PO TABS
20.0000 mg | ORAL_TABLET | Freq: Two times a day (BID) | ORAL | Status: DC
  Administered 2019-01-14 – 2019-01-19 (×11): 20 mg via ORAL
  Filled 2019-01-14 (×10): qty 1

## 2019-01-14 MED ORDER — FUROSEMIDE 20 MG PO TABS: 20.0000 mg | ORAL_TABLET | Freq: Every day | ORAL | Status: DC

## 2019-01-14 MED ORDER — SODIUM CHLORIDE 0.9 % IV SOLN
INTRAVENOUS | Status: DC
  Administered 2019-01-14: 100 mL/h via INTRAVENOUS
  Administered 2019-01-14 – 2019-01-15 (×3): via INTRAVENOUS

## 2019-01-14 MED ORDER — ACETAMINOPHEN 325 MG PO TABS
650.0000 mg | ORAL_TABLET | Freq: Three times a day (TID) | ORAL | Status: DC
  Administered 2019-01-14 – 2019-01-15 (×4): 650 mg via ORAL
  Filled 2019-01-14 (×4): qty 2

## 2019-01-14 MED ORDER — POTASSIUM CHLORIDE IN NACL 20-0.9 MEQ/L-% IV SOLN
INTRAVENOUS | Status: DC
  Administered 2019-01-14: 02:00:00 via INTRAVENOUS
  Filled 2019-01-14: qty 1000

## 2019-01-14 MED ORDER — IRBESARTAN 75 MG PO TABS
37.5000 mg | ORAL_TABLET | Freq: Every day | ORAL | Status: DC
  Administered 2019-01-14 – 2019-01-19 (×5): 37.5 mg via ORAL
  Filled 2019-01-14 (×6): qty 0.5

## 2019-01-14 NOTE — Plan of Care (Signed)

## 2019-01-14 NOTE — Progress Notes (Signed)
Patient trasfered from ED to 5W room 24 via stretcher; alert and oriented x 3 (person,place & situation) on O2 4L via nasal cannula,complaining of back pain; w/ ongoing IVF  NS running @ 100 ml/hr thru G22 on right upper arm; patient has bruises on bilateral hand & right knee, open wound noted on right lateral lower leg and right forearm dressed with xeroform and covered with kerlix bandage; gave patient care guide; instructed how to use the call bell and  fall risk precautions. Will continue to monitor the patient.

## 2019-01-14 NOTE — Progress Notes (Addendum)
[facility] Progress Note     Subjective: CC-  Complains of back pain. Has not had pain medication in a few hours. Denies paresthesias or weakness BUE/BLE. MRI c-spine complete.   Lives at home with wife Retired  Objective: Vital signs in last 24 hours: Temp:  [97.9 F (36.6 C)] 97.9 F (36.6 C) (11/05 1841) Pulse Rate:  [72-103] 74 (11/06 0900) Resp:  [15-26] 16 (11/06 0900) BP: (101-150)/(56-90) 150/71 (11/06 0900) SpO2:  [76 %-100 %] 98 % (11/06 0900)    Intake/Output from previous day: No intake/output data recorded. Intake/Output this shift: Total I/O In: 470 [I.V.:470] Out: -   PE: Gen:  Alert, NAD, pleasant HEENT: EOM's intact, PERRL. c-collar in place Card:  RRR, 2+ DP pulses Pulm:  CTAB, no W/R/R, rate and effort normal Abd: Soft, NT/ND, +BS, no HSM. TLSO in place Ext:  cdi dressing to RLE. Calves soft and nontender Neuro: moving all 4 extremities, no gross motor or sensory deficits Psych: A&Ox3  Skin: no rashes noted, warm and dry  Lab Results:  Recent Labs    01/13/19 1828 01/14/19 0417  WBC 18.0* 9.1  HGB 14.0  14.3 11.1*  HCT 42.7  42.0 34.4*  PLT 228 151   BMET Recent Labs    01/13/19 1828 01/14/19 0417 01/14/19 0730  NA 141  142 141  --   K 4.3  4.2 7.4* 5.3*  CL 107  109 118*  --   CO2 19* 17*  --   GLUCOSE 113*  104* 96  --   BUN 25*  24* 24*  --   CREATININE 1.81*  1.50* 1.24  --   CALCIUM 9.6 6.9*  --    PT/INR Recent Labs    01/13/19 1828  LABPROT 14.7  INR 1.2   CMP     Component Value Date/Time   NA 141 01/14/2019 0417   K 5.3 (H) 01/14/2019 0730   CL 118 (H) 01/14/2019 0417   CO2 17 (L) 01/14/2019 0417   GLUCOSE 96 01/14/2019 0417   BUN 24 (H) 01/14/2019 0417   CREATININE 1.24 01/14/2019 0417   CALCIUM 6.9 (L) 01/14/2019 0417   PROT 4.6 (L) 01/14/2019 0417   ALBUMIN 2.8 (L) 01/14/2019 0417   AST 58 (H) 01/14/2019 0417   ALT 40 01/14/2019 0417   ALKPHOS 45 01/14/2019 0417   BILITOT 0.6  01/14/2019 0417   GFRNONAA >60 01/14/2019 0417   GFRAA >60 01/14/2019 0417   Lipase  No results found for: LIPASE     Studies/Results: Ct Head Wo Contrast  Result Date: 01/13/2019 CLINICAL DATA:  [place] off a ladder. EXAM: CT HEAD WITHOUT CONTRAST CT CERVICAL SPINE WITHOUT CONTRAST TECHNIQUE: Multidetector CT imaging of the head and cervical spine was performed following the standard protocol without intravenous contrast. Multiplanar CT image reconstructions of the cervical spine were also generated. COMPARISON:  CT head dated Jul 22, 2018. FINDINGS: CT HEAD FINDINGS Brain: No evidence of acute infarction, hemorrhage, hydrocephalus, extra-axial collection or mass lesion/mass effect. Vascular: No hyperdense vessel or unexpected calcification. Skull: Normal. Negative for fracture or focal lesion. Sinuses/Orbits: No acute finding. Mild ethmoid air cell mucosal thickening. Other: None. CT CERVICAL SPINE FINDINGS Alignment: No traumatic malalignment. Trace retrolisthesis at C5-C6. Skull base and vertebrae: No acute fracture in the cervical spine. Acute minimally displaced fractures of the T2 and T3 spinous processes. No primary bone lesion or focal pathologic process. 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 Soft tissues and spinal canal:  No prevertebral fluid or swelling. Suspected long segment extra-axial hematoma along the anterior aspect of the cervical cord extending from C2-C3 to at least C6-C7 with narrowing of the thecal sac (series 5, image 28). Disc levels: Moderate to severe disc height loss and uncovertebral hypertrophy at C6-C7. Upper chest: Probable pulmonary contusion in the left lung apex. Paraseptal emphysema. Other: None. IMPRESSION: 1. Suspected long segment extra-axial hematoma along the anterior aspect of the cervical cord extending from C2-C3 to the C6-C7 with narrowing of the thecal sac. Hematoma may be subdural in location given thin epidural fat visualized anterior to the  hematoma. MRI of the cervical spine recommended for further evaluation. 2. No acute cervical spine fracture. Acute fractures of the T2 and T3 spinous processes. 3. Probable pulmonary contusion in the left lung apex. 4.  No acute intracranial abnormality. Critical Value/emergent results were called by telephone at the time of interpretation on 01/13/2019 at 8:00 pm to [place] , who verbally acknowledged these results. Electronically Signed   By: Titus Dubin M.D.   On: 01/13/2019 20:04   Ct Chest W Contrast  Result Date: 01/13/2019 CLINICAL DATA:  Fall off ladder.  Abdominal trauma. EXAM: CT CHEST, ABDOMEN, AND PELVIS WITH CONTRAST TECHNIQUE: Multidetector CT imaging of the chest, abdomen and pelvis was performed following the standard protocol during bolus administration of intravenous contrast. CONTRAST:  132mL OMNIPAQUE IOHEXOL 300 MG/ML  SOLN COMPARISON:  None. FINDINGS: CT CHEST FINDINGS Cardiovascular: Prior CABG. Heart is normal size. Aorta is normal caliber. No evidence of aortic dissection or injury. Mediastinum/Nodes: No mediastinal, hilar, or axillary adenopathy. Trachea and esophagus are unremarkable. Thyroid unremarkable. No mediastinal hematoma. Lungs/Pleura: Moderate centrilobular and paraseptal emphysema. Dependent atelectasis. No confluent opacities. No pneumothorax. Musculoskeletal: Posterior right 11th and 12th rib fractures. Multiple old healed left rib fractures. There appear to be acute rib fractures on the left at the lateral left 5th and 6th ribs. CT ABDOMEN PELVIS FINDINGS Hepatobiliary: No focal hepatic abnormality. Gallbladder unremarkable. Pancreas: No focal abnormality or ductal dilatation. Spleen: No focal abnormality.  Normal size. Adrenals/Urinary Tract: No adrenal abnormality. No focal renal abnormality. No stones or hydronephrosis. Urinary bladder is unremarkable. Stomach/Bowel: Stomach, large and small bowel grossly unremarkable. Normal appendix.  Vascular/Lymphatic: Aortic atherosclerosis. No enlarged abdominal or pelvic lymph nodes. Reproductive: No visible focal abnormality. Other: There is right posterior chest wall/abdominal wall and retroperitoneal hematoma. Hematoma noted around the right psoas muscle and iliopsoas muscle, posterior to the right kidney. Musculoskeletal: Fractures through the right transverse processes of L1 through L4. Acute fracture through the inferior aspect of the L1 vertebral body. Mild compression through the inferior endplate of L4 is age indeterminate. IMPRESSION: Fractures through the posterior right 11th and 12th ribs. Fractures through the right L1 through L4 transverse processes and the inferior L1 vertebral body. A mix of old and new fractures in the left ribs. No pneumothorax. Emphysema. Right retroperitoneal and flank hematoma. No evidence of solid organ injury. Aortic atherosclerosis. These results were called by telephone at the time of interpretation on 01/13/2019 at 8:24 pm to provider [facility] , who verbally acknowledged these results. Electronically Signed   By: Rolm Baptise M.D.   On: 01/13/2019 20:24   Ct Cervical Spine Wo Contrast  Result Date: 01/13/2019 CLINICAL DATA:  [place] off a ladder. EXAM: CT HEAD WITHOUT CONTRAST CT CERVICAL SPINE WITHOUT CONTRAST TECHNIQUE: Multidetector CT imaging of the head and cervical spine was performed following the standard protocol without intravenous contrast. Multiplanar CT image reconstructions of  the cervical spine were also generated. COMPARISON:  CT head dated Jul 22, 2018. FINDINGS: CT HEAD FINDINGS Brain: No evidence of acute infarction, hemorrhage, hydrocephalus, extra-axial collection or mass lesion/mass effect. Vascular: No hyperdense vessel or unexpected calcification. Skull: Normal. Negative for fracture or focal lesion. Sinuses/Orbits: No acute finding. Mild ethmoid air cell mucosal thickening. Other: None. CT CERVICAL SPINE FINDINGS Alignment: No traumatic  malalignment. Trace retrolisthesis at C5-C6. Skull base and vertebrae: No acute fracture in the cervical spine. Acute minimally displaced fractures of the T2 and T3 spinous processes. No primary bone lesion or focal pathologic process. 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 Soft tissues and spinal canal: No prevertebral fluid or swelling. Suspected long segment extra-axial hematoma along the anterior aspect of the cervical cord extending from C2-C3 to at least C6-C7 with narrowing of the thecal sac (series 5, image 28). Disc levels: Moderate to severe disc height loss and uncovertebral hypertrophy at C6-C7. Upper chest: Probable pulmonary contusion in the left lung apex. Paraseptal emphysema. Other: None. IMPRESSION: 1. Suspected long segment extra-axial hematoma along the anterior aspect of the cervical cord extending from C2-C3 to the C6-C7 with narrowing of the thecal sac. Hematoma may be subdural in location given thin epidural fat visualized anterior to the hematoma. MRI of the cervical spine recommended for further evaluation. 2. No acute cervical spine fracture. Acute fractures of the T2 and T3 spinous processes. 3. Probable pulmonary contusion in the left lung apex. 4.  No acute intracranial abnormality. Critical Value/emergent results were called by telephone at the time of interpretation on 01/13/2019 at 8:00 pm to [place] , who verbally acknowledged these results. Electronically Signed   By: Titus Dubin M.D.   On: 01/13/2019 20:04   Mr Cervical Spine W Or Wo Contrast  Result Date: 01/13/2019 CLINICAL DATA:  Initial evaluation for acute trauma, fall. EXAM: MRI CERVICAL SPINE WITHOUT AND WITH CONTRAST TECHNIQUE: Multiplanar and multiecho pulse sequences of the cervical spine, to include the craniocervical junction and cervicothoracic junction, were obtained without and with intravenous contrast. CONTRAST:  18mL GADAVIST GADOBUTROL 1 MMOL/ML IV SOLN COMPARISON:  Prior CT  from earlier the same day. FINDINGS: Alignment: Vertebral bodies normally aligned with preservation of the normal cervical lordosis. No listhesis or subluxation. Vertebrae: Vertebral body height maintained without evidence for acute or chronic fracture. Previously identified fractures involving the spinous processes of T2 and T3 again noted, better evaluated on prior CT. No other visible fracture by MRI. Bone marrow signal intensity within normal limits. No discrete or worrisome osseous lesions. No other abnormal marrow edema or enhancement. Cord: Thin T1 hyperintense collection extending along the posterior aspect of the cervical spinal column does not definitely saturate on STIR sequence, consistent with a thin hematoma, likely epidural (series 8, image 16). This extends from the C1-2 level to approximately the level of C5-6, and measures no more than 2 mm in maximal AP diameter. Additionally, there is epidural hemorrhage involving the dorsal epidural space extending from C4 through the visualized upper thoracic spine, maximal in nature at the level of T2 and T3. This measures up to approximately 6 mm in maximal AP diameter. Underlying cervical spinal cord is normal in appearance without evidence for acute traumatic cord injury. No evidence for ligamentous disruption. Posterior Fossa, vertebral arteries, paraspinal tissues: Chronic microvascular ischemic changes noted within the pons and brainstem. Remainder the visualized brain and posterior fossa otherwise unremarkable. Craniocervical junction within normal limits. Soft tissue edema present within the suboccipital region and  posterior paraspinous musculature, most pronounced about the T2 and T3 spinous process fractures. No abnormal prevertebral edema. Normal intravascular flow voids seen within the vertebral arteries bilaterally. Disc levels: C2-C3: Trace epidural hemorrhage within the ventral epidural space. Otherwise unremarkable without significant stenosis.  C3-C4: Trace epidural hemorrhage within the ventral epidural space. No significant disc bulge. Mild uncovertebral and facet hypertrophy with resultant moderate bilateral C4 foraminal stenosis. C4-C5: Trace epidural hemorrhage within the ventral and dorsal epidural space. No significant disc bulge. Mild spinal stenosis without cord deformity. Uncovertebral and facet hypertrophy with resultant moderate left greater than right C5 foraminal narrowing. C5-C6: Diffuse disc bulge with bilateral uncovertebral hypertrophy. Epidural hemorrhage within the dorsal epidural space. Resultant moderate spinal stenosis. Superimposed uncovertebral and facet hypertrophy with resultant severe bilateral C6 foraminal narrowing. C6-C7: Chronic intervertebral disc space narrowing with diffuse degenerative disc osteophyte. Broad posterior component flattens and partially effaces the ventral thecal sac. Small amount of epidural hemorrhage within the dorsal epidural space. Moderate spinal stenosis. Severe bilateral C7 foraminal narrowing, greater on the left. C7-T1: Minimal disc bulge. Epidural hemorrhage within the dorsal epidural space. Mild to moderate spinal stenosis. Foramina remain patent. T1-2: Negative interspace. Epidural hemorrhage within the dorsal epidural space. Mild to moderate spinal stenosis. Foramina remain patent. T2-3: Normal interspace. Epidural hemorrhage within the dorsal epidural space. Resultant mild-to-moderate spinal stenosis. Foramina remain patent. IMPRESSION: 1. Thin 2 mm ventral epidural hematoma extending from C1-2 to C5-6 without significant spinal stenosis. Visualized upper thoracic spine as above, maximal at the level of T2 and T3. No significant stenosis. 2. Additional dorsal epidural hematoma measuring up to 6 mm in AP diameter extending from C4 through the visualized upper thoracic spine, resulting in mild to moderate diffuse spinal stenosis. 3. No evidence for traumatic cord injury or ligamentous  disruption. 4. Acute fractures of the T2 and T3 spinous processes, better seen on prior CT. 5. Multilevel degenerative spondylosis, most pronounced at C5-6 and C6-7 with resultant moderate spinal stenosis and severe bilateral C6 and C7 foraminal narrowing. Electronically Signed   By: Jeannine Boga M.D.   On: 01/13/2019 23:01   Ct Abdomen Pelvis W Contrast  Result Date: 01/13/2019 CLINICAL DATA:  Fall off ladder.  Abdominal trauma. EXAM: CT CHEST, ABDOMEN, AND PELVIS WITH CONTRAST TECHNIQUE: Multidetector CT imaging of the chest, abdomen and pelvis was performed following the standard protocol during bolus administration of intravenous contrast. CONTRAST:  143mL OMNIPAQUE IOHEXOL 300 MG/ML  SOLN COMPARISON:  None. FINDINGS: CT CHEST FINDINGS Cardiovascular: Prior CABG. Heart is normal size. Aorta is normal caliber. No evidence of aortic dissection or injury. Mediastinum/Nodes: No mediastinal, hilar, or axillary adenopathy. Trachea and esophagus are unremarkable. Thyroid unremarkable. No mediastinal hematoma. Lungs/Pleura: Moderate centrilobular and paraseptal emphysema. Dependent atelectasis. No confluent opacities. No pneumothorax. Musculoskeletal: Posterior right 11th and 12th rib fractures. Multiple old healed left rib fractures. There appear to be acute rib fractures on the left at the lateral left 5th and 6th ribs. CT ABDOMEN PELVIS FINDINGS Hepatobiliary: No focal hepatic abnormality. Gallbladder unremarkable. Pancreas: No focal abnormality or ductal dilatation. Spleen: No focal abnormality.  Normal size. Adrenals/Urinary Tract: No adrenal abnormality. No focal renal abnormality. No stones or hydronephrosis. Urinary bladder is unremarkable. Stomach/Bowel: Stomach, large and small bowel grossly unremarkable. Normal appendix. Vascular/Lymphatic: Aortic atherosclerosis. No enlarged abdominal or pelvic lymph nodes. Reproductive: No visible focal abnormality. Other: There is right posterior chest  wall/abdominal wall and retroperitoneal hematoma. Hematoma noted around the right psoas muscle and iliopsoas muscle, posterior to the right kidney.  Musculoskeletal: Fractures through the right transverse processes of L1 through L4. Acute fracture through the inferior aspect of the L1 vertebral body. Mild compression through the inferior endplate of L4 is age indeterminate. IMPRESSION: Fractures through the posterior right 11th and 12th ribs. Fractures through the right L1 through L4 transverse processes and the inferior L1 vertebral body. A mix of old and new fractures in the left ribs. No pneumothorax. Emphysema. Right retroperitoneal and flank hematoma. No evidence of solid organ injury. Aortic atherosclerosis. These results were called by telephone at the time of interpretation on 01/13/2019 at 8:24 pm to provider [facility] , who verbally acknowledged these results. Electronically Signed   By: Rolm Baptise M.D.   On: 01/13/2019 20:24   Dg Pelvis Portable  Result Date: 01/13/2019 CLINICAL DATA:  Pt paged as Level 2 trauma for fall, possible SOB and CP. Pt unable to give hx. Best obtainable imaging due to traumatrauma EXAM: PORTABLE PELVIS 1-2 VIEWS COMPARISON:  None. FINDINGS: No evidence for acute fracture or subluxation. The RIGHT iliac wing and RIGHT greater trochanter are excluded. Surgical clips overlie the LEFT ischium. Degenerative changes are seen in the LOWER lumbar spine. Visualized bowel gas pattern is nonobstructive. IMPRESSION: No evidence for acute abnormality. Electronically Signed   By: Nolon Nations M.D.   On: 01/13/2019 19:35   Dg Chest Port 1 View  Result Date: 01/13/2019 CLINICAL DATA:  Trauma, fall and shortness of breath. EXAM: PORTABLE CHEST 1 VIEW COMPARISON:  11/01/2018 and prior radiographs FINDINGS: Cardiomediastinal silhouette is unchanged with evidence of CABG noted. There is no evidence of focal airspace disease, pulmonary edema, suspicious pulmonary nodule/mass, pleural  effusion, or pneumothorax. No acute bony abnormalities are identified. Multiple remote LEFT rib fractures are unchanged. IMPRESSION: No evidence of acute cardiopulmonary disease. Electronically Signed   By: Margarette Canada M.D.   On: 01/13/2019 19:36    Anti-infectives: Anti-infectives (From admission, onward)   None       Assessment/Plan Fall from ladder Rib fx R 11/12 and L 5/6 - pain control/pulm toilet LUL pulmonary contusion - pulm toilet Rt flank/retroperitoneal hematoma - pain control. Monitor hgb R L1-L4 TP fxs, L1 inferior endplate fx, L4 compression fx - per NS, TLSO when upright and OOB T2 and T3 SP fxs - pain control, no bracing needed Epidural hematoma from C1-2 to C5-6 and C4 through upper T spine - will discuss c-collar recs with NS. Hold anticoagulation and anti-platelet therapy at present. q2 neuro checks Elevated Creatinine - improved with IVF, continue Possible dehydration - continue IVF RLE skin abrasion with exposed muscle - local wound care Hyperkalemia - K 5.3, no K in IVF, recheck in AM HTN HLD H/o CABG Chronic back pain - on tramadol and gabapentin at home Recent behavorial/cognitive changes - ?dementia GERD  ID - none FEN - IVF, CLD ADAT VTE - SCDs only Foley - condem cath Follow up - TBD  Plan - PT/OT. Schedule tylenol, robaxin, gabapentin. Will reconcile home meds once complete per pharmacy.   LOS: 1 day    [place], [facility] Surgery 01/14/2019, 9:58 AM Please see Amion for pager number during day hours 7:00am-4:30pm

## 2019-01-14 NOTE — Evaluation (Signed)
Physical Therapy Evaluation Patient Details Name: Carlos Rowland MRN: [id number] DOB: 1950/09/05 Today's Date: 01/14/2019   History of Present Illness  Pt is a 68 y.o. M with significant PMH of CABG, HTN, chronic back pain, recent fall off ladder with rib fx who presents after a fall off a ladder with rib fractures R 11/2, L 5/6, LUL pulmonary contusion, Rt flank hematoma, R L1-L4 TP fxs, L1 inf endplate fx, L4 compr fx, T2 and T3 SP fxs, epidural hematoma from C1-2 to C5-6 and C4 through upper T spine, RLE skin abrasian with exposed muscle.  Clinical Impression  Pt admitted with above. Pt presents with decreased functional mobility secondary to pain, weakness, decreased activity tolerance, balance impairments. Requiring up to maximal assist for bed mobility and moderate assist to stand from edge of bed and take a few side steps. HR peak 103 bpm, SpO2 > 90% on 3L O2. Prior to admission, pt lives with his wife and is independent with mobility/ADL's. Currently recommending CIR to maximize functional independence and decrease caregiver burden.      Follow Up Recommendations CIR    Equipment Recommendations  Rolling walker with 5" wheels;3in1 (PT)    Recommendations for Other Services Rehab consult;OT consult     Precautions / Restrictions Precautions Precautions: Fall;Back Precaution Booklet Issued: No Required Braces or Orthoses: Spinal Brace Spinal Brace: Thoracolumbosacral orthotic;Other (comment)("on when OOB.") Restrictions Weight Bearing Restrictions: No      Mobility  Bed Mobility Overal bed mobility: Needs Assistance Bed Mobility: Rolling;Sidelying to Sit;Sit to Sidelying Rolling: Mod assist Sidelying to sit: Max assist     Sit to sidelying: Mod assist General bed mobility comments: Pt rolling onto right side with modA, sitting up with assist for trunk elevation, then required assist for BLE negotiation back into bed  Transfers Overall transfer level: Needs  assistance Equipment used: Rolling walker (2 wheeled) Transfers: Sit to/from Stand Sit to Stand: Mod assist         General transfer comment: ModA to stand from edge of bed, cues for hand placement. Took right side steps with increased time and effort  Ambulation/Gait                Stairs            Wheelchair Mobility    Modified Rankin (Stroke Patients Only)       Balance Overall balance assessment: Needs assistance Sitting-balance support: Feet supported Sitting balance-Leahy Scale: Good     Standing balance support: Bilateral upper extremity supported Standing balance-Leahy Scale: Poor Standing balance comment: reliant on external support                             Pertinent Vitals/Pain Pain Assessment: Faces Faces Pain Scale: Hurts worst Pain Location: R flank Pain Descriptors / Indicators: Grimacing;Guarding Pain Intervention(s): Limited activity within patient's tolerance;Monitored during session;Repositioned;Premedicated before session    Home Living Family/patient expects to be discharged to:: Private residence Living Arrangements: Spouse/significant other Available Help at Discharge: Family Type of Home: House Home Access: Stairs to enter Entrance Stairs-Rails: Psychiatric nurse of Steps: 3 Home Layout: One level Home Equipment: None      Prior Function Level of Independence: Independent         Comments: Retired, enjoys doing yard work     Journalist, newspaper        Extremity/Trunk Assessment   Upper Extremity Assessment Upper Extremity Assessment: Overall WFL for tasks assessed  Lower Extremity Assessment Lower Extremity Assessment: RLE deficits/detail;LLE deficits/detail RLE Deficits / Details: Grossly 2-/5; limited by pain LLE Deficits / Details: Grossly 2+/5; limited by pain       Communication   Communication: No difficulties  Cognition Arousal/Alertness: Awake/alert Behavior During  Therapy: WFL for tasks assessed/performed Overall Cognitive Status: Within Functional Limits for tasks assessed                                 General Comments: Pt recently given Dilaudid, drowsy initially. overall, A&Ox4, answering questions appopriately during evaluation. Will need higher cognitive assessment      General Comments      Exercises     Assessment/Plan    PT Assessment Patient needs continued PT services  PT Problem List Decreased strength;Decreased activity tolerance;Decreased balance;Decreased mobility;Pain       PT Treatment Interventions DME instruction;Gait training;Functional mobility training;Stair training;Therapeutic activities;Therapeutic exercise;Balance training;Patient/family education    PT Goals (Current goals can be found in the Care Plan section)  Acute Rehab PT Goals Patient Stated Goal: "less pain." PT Goal Formulation: With patient Time For Goal Achievement: 01/28/19 Potential to Achieve Goals: Good    Frequency Min 5X/week   Barriers to discharge        Co-evaluation               AM-PAC PT "6 Clicks" Mobility  Outcome Measure Help needed turning from your back to your side while in a flat bed without using bedrails?: A Lot Help needed moving from lying on your back to sitting on the side of a flat bed without using bedrails?: A Lot Help needed moving to and from a bed to a chair (including a wheelchair)?: A Lot Help needed standing up from a chair using your arms (e.g., wheelchair or bedside chair)?: A Lot Help needed to walk in hospital room?: A Lot Help needed climbing 3-5 steps with a railing? : Total 6 Click Score: 11    End of Session Equipment Utilized During Treatment: Gait belt;Back brace Activity Tolerance: Patient limited by pain Patient left: in bed;with call bell/phone within reach;with bed alarm set;with nursing/sitter in room Nurse Communication: Mobility status PT Visit Diagnosis:  Pain;Difficulty in walking, not elsewhere classified (R26.2) Pain - Right/Left: Right Pain - part of body: (flank, back)    Time: [id number] PT Time Calculation (min) (ACUTE ONLY): 27 min   Charges:   PT Evaluation $PT Eval Moderate Complexity: 1 Mod PT Treatments $Therapeutic Activity: 8-22 mins        Ellamae Sia, PT, DPT Acute Rehabilitation Services Pager [phone] Office [phone]   Willy Eddy 01/14/2019, 5:13 PM

## 2019-01-14 NOTE — ED Notes (Signed)
IV L hand not infusing, leaking onto floor at site, removed. IV placed R AC, see notes. Multiple attempts from x2 nurses required to obtain

## 2019-01-14 NOTE — ED Notes (Signed)
Message sent to general surgery on-call MD concerning bladder scan results >663mL.

## 2019-01-15 LAB — CBC
HCT: 31.9 % — ABNORMAL LOW (ref 39.0–52.0)
Hemoglobin: 10.5 g/dL — ABNORMAL LOW (ref 13.0–17.0)
MCH: 30.4 pg (ref 26.0–34.0)
MCHC: 32.9 g/dL (ref 30.0–36.0)
MCV: 92.5 fL (ref 80.0–100.0)
Platelets: 143 10*3/uL — ABNORMAL LOW (ref 150–400)
RBC: 3.45 MIL/uL — ABNORMAL LOW (ref 4.22–5.81)
RDW: 13.2 % (ref 11.5–15.5)
WBC: 10 10*3/uL (ref 4.0–10.5)
nRBC: 0 % (ref 0.0–0.2)

## 2019-01-15 LAB — BASIC METABOLIC PANEL
Anion gap: 10 (ref 5–15)
BUN: 25 mg/dL — ABNORMAL HIGH (ref 8–23)
CO2: 22 mmol/L (ref 22–32)
Calcium: 8.1 mg/dL — ABNORMAL LOW (ref 8.9–10.3)
Chloride: 107 mmol/L (ref 98–111)
Creatinine, Ser: 1.02 mg/dL (ref 0.61–1.24)
GFR calc Af Amer: >60 mL/min (ref >60)
GFR calc non Af Amer: >60 mL/min (ref >60)
Glucose, Bld: 126 mg/dL — ABNORMAL HIGH (ref 70–99)
Potassium: 4.4 mmol/L (ref 3.5–5.1)
Sodium: 139 mmol/L (ref 135–145)

## 2019-01-15 MED ORDER — TRAMADOL HCL 50 MG PO TABS: 50.0000 mg | ORAL_TABLET | Freq: Four times a day (QID) | ORAL | Status: DC | PRN

## 2019-01-15 MED ORDER — ACETAMINOPHEN 325 MG PO TABS
650.0000 mg | ORAL_TABLET | Freq: Four times a day (QID) | ORAL | Status: DC
  Administered 2019-01-15 – 2019-01-17 (×7): 650 mg via ORAL
  Filled 2019-01-15 (×6): qty 2

## 2019-01-15 MED ORDER — METHOCARBAMOL 500 MG PO TABS
500.0000 mg | ORAL_TABLET | Freq: Four times a day (QID) | ORAL | Status: DC
  Administered 2019-01-15 – 2019-01-17 (×9): 500 mg via ORAL
  Filled 2019-01-15 (×9): qty 1

## 2019-01-15 MED ORDER — PROMETHAZINE HCL 25 MG/ML IJ SOLN: 12.5000 mg | Freq: Four times a day (QID) | INTRAMUSCULAR | Status: DC | PRN

## 2019-01-15 NOTE — Progress Notes (Signed)
Patient ID: Carlos Rowland, male   DOB: 04-26-1950, 68 y.o.   MRN: [id number] Patient seems to be doing well complaining of moderate back pain but slowly improving denies any lower extremity symptoms except what is related to his soft tissue injury   neurologically appears to be stable at his baseline moves extremities well   continue to mobilize in his brace

## 2019-01-15 NOTE — Plan of Care (Signed)

## 2019-01-15 NOTE — Progress Notes (Signed)
[facility] Progress Note     Subjective: CC-  Sitting up in bed. Complaining of back pain, although it is a little better than yesterday. Denies weakness or paresthesias BUE/BLE. He did require multiple doses of dilaudid over night. No appetite. Tolerating liquids. Denies n/v. Passing flatus, no BM. O2 sats upper 90's on 2L supplemental O2 at rest. Sats dropped some with ambulation yesterday. Denies SOB or CP. Does not have IS.  Therapies recommending CIR.  Objective: Vital signs in last 24 hours: Temp:  [97.7 F (36.5 C)-98.8 F (37.1 C)] 98.3 F (36.8 C) (11/07 0812) Pulse Rate:  [71-90] 71 (11/07 0812) Resp:  [12-31] 21 (11/07 0812) BP: (105-156)/(47-77) 114/54 (11/07 0812) SpO2:  [88 %-98 %] 88 % (11/07 0812) Weight:  [90.3 kg] 90.3 kg (11/07 0334) Last BM Date: 01/13/19  Intake/Output from previous day: 11/06 0701 - 11/07 0700 In: 1779.6 [I.V.:1779.6] Out: 1750 [Urine:1750] Intake/Output this shift: No intake/output data recorded.  PE: Gen:  Alert, NAD, pleasant HEENT: EOM's intact, pupils equal and round Card:  RRR, 2+ DP pulses Pulm:  CTAB, no W/R/R, rate and effort normal, pulling 1100 on IS Abd: Soft, NT/ND, +BS, no HSM Ext:  Calves soft and nontender. RLE wound clean with exposed muscle, no cellulitis or purulent drainage >> dressing changed Neuro: moving all 4 extremities, no gross motor or sensory deficits Psych: A&Ox3  Skin: no rashes noted, warm and dry  Lab Results:  Recent Labs    01/14/19 0417 01/15/19 0229  WBC 9.1 10.0  HGB 11.1* 10.5*  HCT 34.4* 31.9*  PLT 151 143*   BMET Recent Labs    01/14/19 0417 01/14/19 0730 01/15/19 0229  NA 141  --  139  K 7.4* 5.3* 4.4  CL 118*  --  107  CO2 17*  --  22  GLUCOSE 96  --  126*  BUN 24*  --  25*  CREATININE 1.24  --  1.02  CALCIUM 6.9*  --  8.1*   PT/INR Recent Labs    01/13/19 1828  LABPROT 14.7  INR 1.2   CMP     Component Value Date/Time   NA 139 01/15/2019 0229    K 4.4 01/15/2019 0229   CL 107 01/15/2019 0229   CO2 22 01/15/2019 0229   GLUCOSE 126 (H) 01/15/2019 0229   BUN 25 (H) 01/15/2019 0229   CREATININE 1.02 01/15/2019 0229   CALCIUM 8.1 (L) 01/15/2019 0229   PROT 4.6 (L) 01/14/2019 0417   ALBUMIN 2.8 (L) 01/14/2019 0417   AST 58 (H) 01/14/2019 0417   ALT 40 01/14/2019 0417   ALKPHOS 45 01/14/2019 0417   BILITOT 0.6 01/14/2019 0417   GFRNONAA >60 01/15/2019 0229   GFRAA >60 01/15/2019 0229   Lipase  No results found for: LIPASE     Studies/Results: Ct Head Wo Contrast  Result Date: 01/13/2019 CLINICAL DATA:  [place] off a ladder. EXAM: CT HEAD WITHOUT CONTRAST CT CERVICAL SPINE WITHOUT CONTRAST TECHNIQUE: Multidetector CT imaging of the head and cervical spine was performed following the standard protocol without intravenous contrast. Multiplanar CT image reconstructions of the cervical spine were also generated. COMPARISON:  CT head dated Jul 22, 2018. FINDINGS: CT HEAD FINDINGS Brain: No evidence of acute infarction, hemorrhage, hydrocephalus, extra-axial collection or mass lesion/mass effect. Vascular: No hyperdense vessel or unexpected calcification. Skull: Normal. Negative for fracture or focal lesion. Sinuses/Orbits: No acute finding. Mild ethmoid air cell mucosal thickening. Other: None. CT CERVICAL SPINE FINDINGS Alignment: No traumatic  malalignment. Trace retrolisthesis at C5-C6. Skull base and vertebrae: No acute fracture in the cervical spine. Acute minimally displaced fractures of the T2 and T3 spinous processes. No primary bone lesion or focal pathologic process. 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 Soft tissues and spinal canal: No prevertebral fluid or swelling. Suspected long segment extra-axial hematoma along the anterior aspect of the cervical cord extending from C2-C3 to at least C6-C7 with narrowing of the thecal sac (series 5, image 28). Disc levels: Moderate to severe disc height loss and uncovertebral  hypertrophy at C6-C7. Upper chest: Probable pulmonary contusion in the left lung apex. Paraseptal emphysema. Other: None. IMPRESSION: 1. Suspected long segment extra-axial hematoma along the anterior aspect of the cervical cord extending from C2-C3 to the C6-C7 with narrowing of the thecal sac. Hematoma may be subdural in location given thin epidural fat visualized anterior to the hematoma. MRI of the cervical spine recommended for further evaluation. 2. No acute cervical spine fracture. Acute fractures of the T2 and T3 spinous processes. 3. Probable pulmonary contusion in the left lung apex. 4.  No acute intracranial abnormality. Critical Value/emergent results were called by telephone at the time of interpretation on 01/13/2019 at 8:00 pm to [place] , who verbally acknowledged these results. Electronically Signed   By: Titus Dubin M.D.   On: 01/13/2019 20:04   Ct Chest W Contrast  Result Date: 01/13/2019 CLINICAL DATA:  Fall off ladder.  Abdominal trauma. EXAM: CT CHEST, ABDOMEN, AND PELVIS WITH CONTRAST TECHNIQUE: Multidetector CT imaging of the chest, abdomen and pelvis was performed following the standard protocol during bolus administration of intravenous contrast. CONTRAST:  139mL OMNIPAQUE IOHEXOL 300 MG/ML  SOLN COMPARISON:  None. FINDINGS: CT CHEST FINDINGS Cardiovascular: Prior CABG. Heart is normal size. Aorta is normal caliber. No evidence of aortic dissection or injury. Mediastinum/Nodes: No mediastinal, hilar, or axillary adenopathy. Trachea and esophagus are unremarkable. Thyroid unremarkable. No mediastinal hematoma. Lungs/Pleura: Moderate centrilobular and paraseptal emphysema. Dependent atelectasis. No confluent opacities. No pneumothorax. Musculoskeletal: Posterior right 11th and 12th rib fractures. Multiple old healed left rib fractures. There appear to be acute rib fractures on the left at the lateral left 5th and 6th ribs. CT ABDOMEN PELVIS FINDINGS Hepatobiliary: No focal  hepatic abnormality. Gallbladder unremarkable. Pancreas: No focal abnormality or ductal dilatation. Spleen: No focal abnormality.  Normal size. Adrenals/Urinary Tract: No adrenal abnormality. No focal renal abnormality. No stones or hydronephrosis. Urinary bladder is unremarkable. Stomach/Bowel: Stomach, large and small bowel grossly unremarkable. Normal appendix. Vascular/Lymphatic: Aortic atherosclerosis. No enlarged abdominal or pelvic lymph nodes. Reproductive: No visible focal abnormality. Other: There is right posterior chest wall/abdominal wall and retroperitoneal hematoma. Hematoma noted around the right psoas muscle and iliopsoas muscle, posterior to the right kidney. Musculoskeletal: Fractures through the right transverse processes of L1 through L4. Acute fracture through the inferior aspect of the L1 vertebral body. Mild compression through the inferior endplate of L4 is age indeterminate. IMPRESSION: Fractures through the posterior right 11th and 12th ribs. Fractures through the right L1 through L4 transverse processes and the inferior L1 vertebral body. A mix of old and new fractures in the left ribs. No pneumothorax. Emphysema. Right retroperitoneal and flank hematoma. No evidence of solid organ injury. Aortic atherosclerosis. These results were called by telephone at the time of interpretation on 01/13/2019 at 8:24 pm to provider [facility] , who verbally acknowledged these results. Electronically Signed   By: Rolm Baptise M.D.   On: 01/13/2019 20:24   Ct Cervical  Spine Wo Contrast  Result Date: 01/13/2019 CLINICAL DATA:  [place] off a ladder. EXAM: CT HEAD WITHOUT CONTRAST CT CERVICAL SPINE WITHOUT CONTRAST TECHNIQUE: Multidetector CT imaging of the head and cervical spine was performed following the standard protocol without intravenous contrast. Multiplanar CT image reconstructions of the cervical spine were also generated. COMPARISON:  CT head dated Jul 22, 2018. FINDINGS: CT HEAD FINDINGS  Brain: No evidence of acute infarction, hemorrhage, hydrocephalus, extra-axial collection or mass lesion/mass effect. Vascular: No hyperdense vessel or unexpected calcification. Skull: Normal. Negative for fracture or focal lesion. Sinuses/Orbits: No acute finding. Mild ethmoid air cell mucosal thickening. Other: None. CT CERVICAL SPINE FINDINGS Alignment: No traumatic malalignment. Trace retrolisthesis at C5-C6. Skull base and vertebrae: No acute fracture in the cervical spine. Acute minimally displaced fractures of the T2 and T3 spinous processes. No primary bone lesion or focal pathologic process. 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 Soft tissues and spinal canal: No prevertebral fluid or swelling. Suspected long segment extra-axial hematoma along the anterior aspect of the cervical cord extending from C2-C3 to at least C6-C7 with narrowing of the thecal sac (series 5, image 28). Disc levels: Moderate to severe disc height loss and uncovertebral hypertrophy at C6-C7. Upper chest: Probable pulmonary contusion in the left lung apex. Paraseptal emphysema. Other: None. IMPRESSION: 1. Suspected long segment extra-axial hematoma along the anterior aspect of the cervical cord extending from C2-C3 to the C6-C7 with narrowing of the thecal sac. Hematoma may be subdural in location given thin epidural fat visualized anterior to the hematoma. MRI of the cervical spine recommended for further evaluation. 2. No acute cervical spine fracture. Acute fractures of the T2 and T3 spinous processes. 3. Probable pulmonary contusion in the left lung apex. 4.  No acute intracranial abnormality. Critical Value/emergent results were called by telephone at the time of interpretation on 01/13/2019 at 8:00 pm to [place] , who verbally acknowledged these results. Electronically Signed   By: Titus Dubin M.D.   On: 01/13/2019 20:04   Mr Cervical Spine W Or Wo Contrast  Result Date: 01/13/2019 CLINICAL  DATA:  Initial evaluation for acute trauma, fall. EXAM: MRI CERVICAL SPINE WITHOUT AND WITH CONTRAST TECHNIQUE: Multiplanar and multiecho pulse sequences of the cervical spine, to include the craniocervical junction and cervicothoracic junction, were obtained without and with intravenous contrast. CONTRAST:  76mL GADAVIST GADOBUTROL 1 MMOL/ML IV SOLN COMPARISON:  Prior CT from earlier the same day. FINDINGS: Alignment: Vertebral bodies normally aligned with preservation of the normal cervical lordosis. No listhesis or subluxation. Vertebrae: Vertebral body height maintained without evidence for acute or chronic fracture. Previously identified fractures involving the spinous processes of T2 and T3 again noted, better evaluated on prior CT. No other visible fracture by MRI. Bone marrow signal intensity within normal limits. No discrete or worrisome osseous lesions. No other abnormal marrow edema or enhancement. Cord: Thin T1 hyperintense collection extending along the posterior aspect of the cervical spinal column does not definitely saturate on STIR sequence, consistent with a thin hematoma, likely epidural (series 8, image 16). This extends from the C1-2 level to approximately the level of C5-6, and measures no more than 2 mm in maximal AP diameter. Additionally, there is epidural hemorrhage involving the dorsal epidural space extending from C4 through the visualized upper thoracic spine, maximal in nature at the level of T2 and T3. This measures up to approximately 6 mm in maximal AP diameter. Underlying cervical spinal cord is normal in appearance without evidence for  acute traumatic cord injury. No evidence for ligamentous disruption. Posterior Fossa, vertebral arteries, paraspinal tissues: Chronic microvascular ischemic changes noted within the pons and brainstem. Remainder the visualized brain and posterior fossa otherwise unremarkable. Craniocervical junction within normal limits. Soft tissue edema present  within the suboccipital region and posterior paraspinous musculature, most pronounced about the T2 and T3 spinous process fractures. No abnormal prevertebral edema. Normal intravascular flow voids seen within the vertebral arteries bilaterally. Disc levels: C2-C3: Trace epidural hemorrhage within the ventral epidural space. Otherwise unremarkable without significant stenosis. C3-C4: Trace epidural hemorrhage within the ventral epidural space. No significant disc bulge. Mild uncovertebral and facet hypertrophy with resultant moderate bilateral C4 foraminal stenosis. C4-C5: Trace epidural hemorrhage within the ventral and dorsal epidural space. No significant disc bulge. Mild spinal stenosis without cord deformity. Uncovertebral and facet hypertrophy with resultant moderate left greater than right C5 foraminal narrowing. C5-C6: Diffuse disc bulge with bilateral uncovertebral hypertrophy. Epidural hemorrhage within the dorsal epidural space. Resultant moderate spinal stenosis. Superimposed uncovertebral and facet hypertrophy with resultant severe bilateral C6 foraminal narrowing. C6-C7: Chronic intervertebral disc space narrowing with diffuse degenerative disc osteophyte. Broad posterior component flattens and partially effaces the ventral thecal sac. Small amount of epidural hemorrhage within the dorsal epidural space. Moderate spinal stenosis. Severe bilateral C7 foraminal narrowing, greater on the left. C7-T1: Minimal disc bulge. Epidural hemorrhage within the dorsal epidural space. Mild to moderate spinal stenosis. Foramina remain patent. T1-2: Negative interspace. Epidural hemorrhage within the dorsal epidural space. Mild to moderate spinal stenosis. Foramina remain patent. T2-3: Normal interspace. Epidural hemorrhage within the dorsal epidural space. Resultant mild-to-moderate spinal stenosis. Foramina remain patent. IMPRESSION: 1. Thin 2 mm ventral epidural hematoma extending from C1-2 to C5-6 without  significant spinal stenosis. Visualized upper thoracic spine as above, maximal at the level of T2 and T3. No significant stenosis. 2. Additional dorsal epidural hematoma measuring up to 6 mm in AP diameter extending from C4 through the visualized upper thoracic spine, resulting in mild to moderate diffuse spinal stenosis. 3. No evidence for traumatic cord injury or ligamentous disruption. 4. Acute fractures of the T2 and T3 spinous processes, better seen on prior CT. 5. Multilevel degenerative spondylosis, most pronounced at C5-6 and C6-7 with resultant moderate spinal stenosis and severe bilateral C6 and C7 foraminal narrowing. Electronically Signed   By: Jeannine Boga M.D.   On: 01/13/2019 23:01   Ct Abdomen Pelvis W Contrast  Result Date: 01/13/2019 CLINICAL DATA:  Fall off ladder.  Abdominal trauma. EXAM: CT CHEST, ABDOMEN, AND PELVIS WITH CONTRAST TECHNIQUE: Multidetector CT imaging of the chest, abdomen and pelvis was performed following the standard protocol during bolus administration of intravenous contrast. CONTRAST:  171mL OMNIPAQUE IOHEXOL 300 MG/ML  SOLN COMPARISON:  None. FINDINGS: CT CHEST FINDINGS Cardiovascular: Prior CABG. Heart is normal size. Aorta is normal caliber. No evidence of aortic dissection or injury. Mediastinum/Nodes: No mediastinal, hilar, or axillary adenopathy. Trachea and esophagus are unremarkable. Thyroid unremarkable. No mediastinal hematoma. Lungs/Pleura: Moderate centrilobular and paraseptal emphysema. Dependent atelectasis. No confluent opacities. No pneumothorax. Musculoskeletal: Posterior right 11th and 12th rib fractures. Multiple old healed left rib fractures. There appear to be acute rib fractures on the left at the lateral left 5th and 6th ribs. CT ABDOMEN PELVIS FINDINGS Hepatobiliary: No focal hepatic abnormality. Gallbladder unremarkable. Pancreas: No focal abnormality or ductal dilatation. Spleen: No focal abnormality.  Normal size. Adrenals/Urinary  Tract: No adrenal abnormality. No focal renal abnormality. No stones or hydronephrosis. Urinary bladder is unremarkable. Stomach/Bowel: Stomach, large  and small bowel grossly unremarkable. Normal appendix. Vascular/Lymphatic: Aortic atherosclerosis. No enlarged abdominal or pelvic lymph nodes. Reproductive: No visible focal abnormality. Other: There is right posterior chest wall/abdominal wall and retroperitoneal hematoma. Hematoma noted around the right psoas muscle and iliopsoas muscle, posterior to the right kidney. Musculoskeletal: Fractures through the right transverse processes of L1 through L4. Acute fracture through the inferior aspect of the L1 vertebral body. Mild compression through the inferior endplate of L4 is age indeterminate. IMPRESSION: Fractures through the posterior right 11th and 12th ribs. Fractures through the right L1 through L4 transverse processes and the inferior L1 vertebral body. A mix of old and new fractures in the left ribs. No pneumothorax. Emphysema. Right retroperitoneal and flank hematoma. No evidence of solid organ injury. Aortic atherosclerosis. These results were called by telephone at the time of interpretation on 01/13/2019 at 8:24 pm to provider [facility] , who verbally acknowledged these results. Electronically Signed   By: Rolm Baptise M.D.   On: 01/13/2019 20:24   Dg Pelvis Portable  Result Date: 01/13/2019 CLINICAL DATA:  Pt paged as Level 2 trauma for fall, possible SOB and CP. Pt unable to give hx. Best obtainable imaging due to traumatrauma EXAM: PORTABLE PELVIS 1-2 VIEWS COMPARISON:  None. FINDINGS: No evidence for acute fracture or subluxation. The RIGHT iliac wing and RIGHT greater trochanter are excluded. Surgical clips overlie the LEFT ischium. Degenerative changes are seen in the LOWER lumbar spine. Visualized bowel gas pattern is nonobstructive. IMPRESSION: No evidence for acute abnormality. Electronically Signed   By: Nolon Nations M.D.   On:  01/13/2019 19:35   Dg Chest Port 1 View  Result Date: 01/13/2019 CLINICAL DATA:  Trauma, fall and shortness of breath. EXAM: PORTABLE CHEST 1 VIEW COMPARISON:  11/01/2018 and prior radiographs FINDINGS: Cardiomediastinal silhouette is unchanged with evidence of CABG noted. There is no evidence of focal airspace disease, pulmonary edema, suspicious pulmonary nodule/mass, pleural effusion, or pneumothorax. No acute bony abnormalities are identified. Multiple remote LEFT rib fractures are unchanged. IMPRESSION: No evidence of acute cardiopulmonary disease. Electronically Signed   By: Margarette Canada M.D.   On: 01/13/2019 19:36    Anti-infectives: Anti-infectives (From admission, onward)   None       Assessment/Plan Fall from ladder Rib fx R 11/12 and L 5/6 - pain control/pulm toilet LUL pulmonary contusion - pulm toilet Rt flank/retroperitoneal hematoma - pain control. ABL anemia - Hgb 10.5 from 11.1, monitor R L1-L4 TP fxs, L1 inferior endplate fx, L4 compression fx - per NS, TLSO when upright and OOB T2 and T3 SP fxs - pain control, no bracing needed Epidural hematoma from C1-2 to C5-6 and C4 through upper T spine - c-spine clear. Hold anticoagulation and anti-platelet therapy at present. q2 neuro checks Elevated Creatinine - resolved RLE skin abrasion with exposed muscle - local wound care Hyperkalemia - resolved HTN - home avapro and lopressor HLD H/o CABG Chronic back pain - on tramadol and gabapentin at home Recent behavorial/cognitive changes - ?dementia GERD - protonix  ID - none FEN - decrease IVF, reg diet, bowel regimen VTE - SCDs only Foley - none Follow up - NS  Plan - Increase scheduled tylenol and robaxin to q6 hours. Wean supplemental oxygen. Continue PT/OT. CIR consult placed.    LOS: 2 days    [facility] 01/15/2019, 9:11 AM Please see Amion for pager number during day hours 7:00am-4:30pm

## 2019-01-15 NOTE — Progress Notes (Addendum)
Occupational Therapy Evaluation Patient Details Name: Carlos Rowland MRN: [id number] DOB: Jan 14, 1951 Today's Date: 01/15/2019    History of Present Illness Pt is a 68 y.o. M with significant PMH of CABG, HTN, chronic back pain, recent fall off ladder with rib fx who presents after a fall off a ladder with rib fractures R 11/2, L 5/6, LUL pulmonary contusion, Rt flank hematoma, R L1-L4 TP fxs, L1 inf endplate fx, L4 compr fx, T2 and T3 SP fxs, epidural hematoma from C1-2 to C5-6 and C4 through upper T spine, RLE skin abrasian with exposed muscle.   Clinical Impression   PTA, pt was living at home with his wife, and reports he was independent with ADL/IADL and functional mobiltiy. Pt reports he enjoys working in the yard. Pt currently requires maxA-totalA to don TLSO brace. He required minA+2-modA+2 for stand-pivot transfer from the EOB to the recliner. Due to decline in current level of function, pt would benefit from acute OT to address established goals to facilitate safe D/C to venue listed below. At this time, recommend CIR follow-up. Will continue to follow acutely.     Follow Up Recommendations  CIR    Equipment Recommendations  3 in 1 bedside commode    Recommendations for Other Services       Precautions / Restrictions Precautions Precautions: Fall;Back Precaution Booklet Issued: No Required Braces or Orthoses: Spinal Brace Spinal Brace: Thoracolumbosacral orthotic;Other (comment)("on when OOB.") Restrictions Weight Bearing Restrictions: No      Mobility Bed Mobility Overal bed mobility: Needs Assistance Bed Mobility: Rolling;Sidelying to Sit;Sit to Sidelying Rolling: Mod assist Sidelying to sit: Max assist     Sit to sidelying: Mod assist General bed mobility comments: Pt rolling onto right side with modA, sitting up with assist for trunk elevation,  Transfers Overall transfer level: Needs assistance Equipment used: Rolling walker (2 wheeled) Transfers: Sit  to/from [facility] Sit to Stand: Mod assist;+2 physical assistance;+2 safety/equipment Stand pivot transfers: Min assist;+2 physical assistance       General transfer comment: ModA+2 to stand from edge of bed, cues for hand placement. minA+2 for stand-pivot to the recliner from the EOB    Balance Overall balance assessment: Needs assistance Sitting-balance support: Feet supported Sitting balance-Leahy Scale: Good     Standing balance support: Bilateral upper extremity supported Standing balance-Leahy Scale: Poor Standing balance comment: reliant on external support                           ADL either performed or assessed with clinical judgement   ADL Overall ADL's : Needs assistance/impaired Eating/Feeding: Set up;Sitting   Grooming: Min guard;Sitting;Minimal assistance Grooming Details (indicate cue type and reason): limited ability to reach secondary to rib pain  Upper Body Bathing: Minimal assistance;Sitting   Lower Body Bathing: Moderate assistance;Sit to/from stand;Minimal assistance;+2 for physical assistance   Upper Body Dressing : Maximal assistance;Sitting;Total assistance Upper Body Dressing Details (indicate cue type and reason): maxA for UB dressing, TotalA for donning TLSO Lower Body Dressing: Moderate assistance;Minimal assistance;+2 for physical assistance   Toilet Transfer: Moderate assistance;+2 for physical assistance;Stand-pivot Toilet Transfer Details (indicate cue type and reason): simulated from EOB to recliner Toileting- Clothing Manipulation and Hygiene: Moderate assistance;+2 for physical assistance       Functional mobility during ADLs: Moderate assistance;+2 for physical assistance;Minimal assistance General ADL Comments: limited by pain in ribs;     Vision         Perception  Praxis      Pertinent Vitals/Pain Pain Assessment: Faces Faces Pain Scale: Hurts worst Pain Location: R flank Pain Descriptors  / Indicators: Grimacing;Guarding Pain Intervention(s): Limited activity within patient's tolerance;Monitored during session;Repositioned     Hand Dominance Right   Extremity/Trunk Assessment Upper Extremity Assessment Upper Extremity Assessment: Overall WFL for tasks assessed(limited use due to rib pain, otherwise WFL)   Lower Extremity Assessment Lower Extremity Assessment: Defer to PT evaluation   Cervical / Trunk Assessment Cervical / Trunk Assessment: Other exceptions Cervical / Trunk Exceptions: back precautions   Communication Communication Communication: No difficulties   Cognition Arousal/Alertness: Awake/alert Behavior During Therapy: WFL for tasks assessed/performed Overall Cognitive Status: Within Functional Limits for tasks assessed                                 General Comments: WFL for basic ADL and mobility, will need higher cognitive assessment   General Comments  pt on 2lnc, supine SpO2 93%, sitting EOB SpO2 85% to 90% after use of incentive spirometer, pt unable to maintain 90% required 3lnc during mobility, spO2 91%-94%;return to sitting in recliner spO2 >95% 2lnc;RN aware    Exercises     Shoulder Instructions      Home Living Family/patient expects to be discharged to:: Private residence Living Arrangements: Spouse/significant other Available Help at Discharge: Family Type of Home: House Home Access: Stairs to enter Technical brewer of Steps: 3 Entrance Stairs-Rails: Right;Left Home Layout: One level     Bathroom Shower/Tub: Tub/shower unit         Home Equipment: None          Prior Functioning/Environment Level of Independence: Independent        Comments: Retired, enjoys doing yard work        OT Problem List: Decreased activity tolerance;Impaired balance (sitting and/or standing);Decreased cognition;Decreased safety awareness;Decreased knowledge of use of DME or AE;Decreased knowledge of  precautions;Cardiopulmonary status limiting activity;Pain      OT Treatment/Interventions: Self-care/ADL training;Therapeutic exercise;Energy conservation;DME and/or AE instruction;Therapeutic activities;Patient/family education;Balance training;Cognitive remediation/compensation    OT Goals(Current goals can be found in the care plan section) Acute Rehab OT Goals Patient Stated Goal: "less pain." OT Goal Formulation: With patient Time For Goal Achievement: 01/29/19 Potential to Achieve Goals: Good ADL Goals Pt Will Perform Grooming: with modified independence Pt Will Perform Upper Body Dressing: with modified independence Pt Will Perform Lower Body Dressing: with modified independence;sit to/from stand;with adaptive equipment Pt Will Transfer to Toilet: with modified independence;stand pivot transfer Additional ADL Goal #1: Pt will demonstrate independence with energy conservation strategies during ADL and functional mobiltiy.  OT Frequency: Min 2X/week   Barriers to D/C: Decreased caregiver support          Co-evaluation PT/OT/SLP Co-Evaluation/Treatment: Yes Reason for Co-Treatment: For patient/therapist safety;To address functional/ADL transfers   OT goals addressed during session: ADL's and self-care      AM-PAC OT "6 Clicks" Daily Activity     Outcome Measure Help from another person eating meals?: None Help from another person taking care of personal grooming?: A Little Help from another person toileting, which includes using toliet, bedpan, or urinal?: A Lot Help from another person bathing (including washing, rinsing, drying)?: A Lot Help from another person to put on and taking off regular upper body clothing?: A Lot Help from another person to put on and taking off regular lower body clothing?: A Lot 6 Click Score: 15  End of Session Equipment Utilized During Treatment: Gait belt;Back brace Nurse Communication: Mobility status(o2 sats)  Activity Tolerance:  Patient limited by pain;Patient tolerated treatment well Patient left: in chair;with call bell/phone within reach;with chair alarm set  OT Visit Diagnosis: Unsteadiness on feet (R26.81);Other abnormalities of gait and mobility (R26.89);Muscle weakness (generalized) (M62.81);History of falling (Z91.81);Other symptoms and signs involving cognitive function;Pain Pain - Right/Left: (bilateral) Pain - part of body: (ribs)                Time: [id number] OT Time Calculation (min): 31 min Charges:  OT General Charges $OT Visit: 1 Visit OT Evaluation $OT Eval Moderate Complexity: [place] OTR/L Acute Rehabilitation Services Office: [place] 01/15/2019, 1:39 PM

## 2019-01-15 NOTE — Progress Notes (Signed)
Physical Therapy Treatment Patient Details Name: Carlos Rowland MRN: [id number] DOB: 1950-11-06 Today's Date: 01/15/2019    History of Present Illness Pt is a 68 y.o. M with significant PMH of CABG, HTN, chronic back pain, recent fall off ladder with rib fx who presents after a fall off a ladder with rib fractures R 11/2, L 5/6, LUL pulmonary contusion, Rt flank hematoma, R L1-L4 TP fxs, L1 inf endplate fx, L4 compr fx, T2 and T3 SP fxs, epidural hematoma from C1-2 to C5-6 and C4 through upper T spine, RLE skin abrasian with exposed muscle.    PT Comments    Patient seen for mobility progression. Pt is making progress toward PT goals and tolerated OOB mobility this session. Pt requires mod A +2 for bed mobility and min/mod A +2 for functional transfer training. Pt with SpO2 desat while mobilizing on 2L O2 to 85%, O2 increased to 3L for OOB mobility, then back to 2L end of session with SpO2 >90% at rest. Continue to progress as tolerated.     Follow Up Recommendations  CIR     Equipment Recommendations  Rolling walker with 5" wheels;3in1 (PT)    Recommendations for Other Services OT consult;Rehab consult     Precautions / Restrictions Precautions Precautions: Fall;Back Precaution Booklet Issued: No Required Braces or Orthoses: Spinal Brace Spinal Brace: Thoracolumbosacral orthotic;Other (comment)("on when OOB.") Restrictions Weight Bearing Restrictions: No    Mobility  Bed Mobility Overal bed mobility: Needs Assistance Bed Mobility: Rolling;Sidelying to Sit;Sit to Sidelying Rolling: Mod assist Sidelying to sit: Max assist     Sit to sidelying: Mod assist General bed mobility comments: Pt rolling onto right side with modA, sitting up with assist for trunk elevation,  Transfers Overall transfer level: Needs assistance Equipment used: 2 person hand held assist Transfers: Sit to/from [facility] Sit to Stand: Mod assist;+2 physical assistance;+2  safety/equipment Stand pivot transfers: Min assist;+2 physical assistance       General transfer comment: ModA+2 to stand from edge of bed with use of bed pad for hip extension, cues for hand placement. minA+2 for stand-pivot to the recliner from the EOB  Ambulation/Gait                 Stairs             Wheelchair Mobility    Modified Rankin (Stroke Patients Only)       Balance Overall balance assessment: Needs assistance Sitting-balance support: Feet supported Sitting balance-Leahy Scale: Good     Standing balance support: Bilateral upper extremity supported Standing balance-Leahy Scale: Poor Standing balance comment: reliant on external support                            Cognition Arousal/Alertness: Awake/alert Behavior During Therapy: WFL for tasks assessed/performed Overall Cognitive Status: Within Functional Limits for tasks assessed                                 General Comments: WFL for basic ADL and mobility, will need higher cognitive assessment      Exercises      General Comments General comments (skin integrity, edema, etc.): pt on 2-3L O2 via [place] with SpO2 up to 95% at rest and down to 85% while mobilizing       Pertinent Vitals/Pain Pain Assessment: Faces Faces Pain Scale: Hurts worst Pain Location: R flank  Pain Descriptors / Indicators: Grimacing;Guarding Pain Intervention(s): Limited activity within patient's tolerance;Monitored during session;Repositioned    Home Living Family/patient expects to be discharged to:: Private residence Living Arrangements: Spouse/significant other Available Help at Discharge: Family Type of Home: House Home Access: Stairs to enter Entrance Stairs-Rails: Right;Left Home Layout: One level Home Equipment: None      Prior Function Level of Independence: Independent      Comments: Retired, enjoys doing yard work   PT Goals (current goals can now be found in the care  plan section) Acute Rehab PT Goals Patient Stated Goal: "less pain." Progress towards PT goals: Progressing toward goals    Frequency    Min 5X/week      PT Plan Current plan remains appropriate    Co-evaluation PT/OT/SLP Co-Evaluation/Treatment: Yes Reason for Co-Treatment: For patient/therapist safety;To address functional/ADL transfers PT goals addressed during session: Mobility/safety with mobility OT goals addressed during session: ADL's and self-care      AM-PAC PT "6 Clicks" Mobility   Outcome Measure  Help needed turning from your back to your side while in a flat bed without using bedrails?: A Lot Help needed moving from lying on your back to sitting on the side of a flat bed without using bedrails?: A Lot Help needed moving to and from a bed to a chair (including a wheelchair)?: A Lot Help needed standing up from a chair using your arms (e.g., wheelchair or bedside chair)?: A Lot Help needed to walk in hospital room?: A Lot Help needed climbing 3-5 steps with a railing? : Total 6 Click Score: 11    End of Session Equipment Utilized During Treatment: Gait belt;Back brace Activity Tolerance: Patient limited by pain Patient left: in chair;with call bell/phone within reach;with chair alarm set Nurse Communication: Mobility status PT Visit Diagnosis: Pain;Difficulty in walking, not elsewhere classified (R26.2) Pain - Right/Left: Right Pain - part of body: (flank, back)     Time: [id number] PT Time Calculation (min) (ACUTE ONLY): 30 min  Charges:  $Gait Training: 8-22 mins                     Earney Navy, PTA Acute Rehabilitation Services Pager: [phone] Office: [phone]     Darliss Cheney 01/15/2019, 4:45 PM

## 2019-01-16 ENCOUNTER — Inpatient Hospital Stay (HOSPITAL_COMMUNITY): Payer: PPO

## 2019-01-16 LAB — CBC
HCT: 28.9 % — ABNORMAL LOW (ref 39.0–52.0)
Hemoglobin: 9.4 g/dL — ABNORMAL LOW (ref 13.0–17.0)
MCH: 30.4 pg (ref 26.0–34.0)
MCHC: 32.5 g/dL (ref 30.0–36.0)
MCV: 93.5 fL (ref 80.0–100.0)
Platelets: 129 10*3/uL — ABNORMAL LOW (ref 150–400)
RBC: 3.09 MIL/uL — ABNORMAL LOW (ref 4.22–5.81)
RDW: 13.2 % (ref 11.5–15.5)
WBC: 8.6 10*3/uL (ref 4.0–10.5)
nRBC: 0 % (ref 0.0–0.2)

## 2019-01-16 LAB — BASIC METABOLIC PANEL
Anion gap: 7 (ref 5–15)
BUN: 22 mg/dL (ref 8–23)
CO2: 26 mmol/L (ref 22–32)
Calcium: 8.2 mg/dL — ABNORMAL LOW (ref 8.9–10.3)
Chloride: 107 mmol/L (ref 98–111)
Creatinine, Ser: 0.93 mg/dL (ref 0.61–1.24)
GFR calc Af Amer: >60 mL/min (ref >60)
GFR calc non Af Amer: >60 mL/min (ref >60)
Glucose, Bld: 119 mg/dL — ABNORMAL HIGH (ref 70–99)
Potassium: 4.6 mmol/L (ref 3.5–5.1)
Sodium: 140 mmol/L (ref 135–145)

## 2019-01-16 MED ORDER — BISACODYL 10 MG RE SUPP
10.0000 mg | Freq: Every day | RECTAL | Status: DC | PRN
  Administered 2019-01-19: 11:00:00 10 mg via RECTAL
  Filled 2019-01-16: qty 1

## 2019-01-16 MED ORDER — HYDROCODONE-ACETAMINOPHEN 5-325 MG PO TABS
1.0000 | ORAL_TABLET | ORAL | Status: DC | PRN
  Filled 2019-01-16: qty 1

## 2019-01-16 MED ORDER — BOOST / RESOURCE BREEZE PO LIQD CUSTOM
1.0000 | Freq: Two times a day (BID) | ORAL | Status: DC
  Administered 2019-01-16 – 2019-01-19 (×7): 1 via ORAL

## 2019-01-16 MED ORDER — HYDROMORPHONE HCL 1 MG/ML IJ SOLN
1.0000 mg | INTRAMUSCULAR | Status: DC | PRN
  Administered 2019-01-16 – 2019-01-19 (×3): 1 mg via INTRAVENOUS
  Filled 2019-01-16 (×3): qty 1

## 2019-01-16 MED ORDER — OXYCODONE HCL 5 MG PO TABS
5.0000 mg | ORAL_TABLET | ORAL | Status: DC | PRN
  Administered 2019-01-16: 11:00:00 5 mg via ORAL
  Filled 2019-01-16: qty 1

## 2019-01-16 MED ORDER — HYDROCODONE-ACETAMINOPHEN 5-325 MG PO TABS
1.0000 | ORAL_TABLET | ORAL | Status: DC
  Administered 2019-01-16 – 2019-01-17 (×3): 1 via ORAL
  Administered 2019-01-17: 04:00:00 2 via ORAL
  Administered 2019-01-17: 09:00:00 1 via ORAL
  Filled 2019-01-16: qty 2
  Filled 2019-01-16 (×2): qty 1
  Filled 2019-01-16: qty 2

## 2019-01-16 MED ORDER — TRAMADOL HCL 50 MG PO TABS: 50.0000 mg | ORAL_TABLET | ORAL | Status: DC | PRN

## 2019-01-16 NOTE — Progress Notes (Signed)
Pt o2 sat 65% RA. Pt asymptomatic. Placed on 4L oxygen and instructed to take deep breaths. New O2 sat 91%. Will continue to monitor.

## 2019-01-16 NOTE — Progress Notes (Signed)
Spoke to patient's wife on the phonand she asked me to have brooke Meuth call her.  I paged Brook and reported purple bruising on right flank area- area of intense pain for the patient.

## 2019-01-16 NOTE — Progress Notes (Signed)
[facility] Progress Note     Subjective: CC-  Back pain a little less today. Denies paresthesias or weakness BUE/BLE. Takes some tramadol at home for chronic back pain and thinks this helps more than the oxycodone he is receiving here. Continues to desat some with ambulation. Reports SOB with mobilization, none at rest. No cough. Pulling 1250 on IS. Not eating much, does not have appetite. Denies abdominal pain, n/v. Passing flatus. No BM since admission.  Objective: Vital signs in last 24 hours: Temp:  [98.2 F (36.8 C)-98.7 F (37.1 C)] 98.7 F (37.1 C) (11/08 0505) Pulse Rate:  [66-73] 71 (11/08 0505) Resp:  [16-22] 16 (11/08 0505) BP: (102-151)/(54-74) 141/56 (11/08 0505) SpO2:  [87 %-99 %] 99 % (11/08 0505) Last BM Date: 01/13/19  Intake/Output from previous day: 11/07 0701 - 11/08 0700 In: 120 [P.O.:120] Out: 1385 [Urine:1385] Intake/Output this shift: No intake/output data recorded.  PE: Gen: Alert, NAD, pleasant HEENT: EOM's intact,pupils equal and round Card: RRR, 2+ DP pulses Pulm: CTAB, no W/R/R, rate andeffort normal, pulling 1250 on IS Abd: Soft, protuberant, nontender, +BS, no HSM [id number] soft and nontender. RLE wound with cdi dressing Neuro: moving all 4 extremities, no gross motor or sensory deficits Psych: A&Ox3  Skin: no rashes noted, warm and dry  Lab Results:  Recent Labs    01/15/19 0229 01/16/19 0333  WBC 10.0 8.6  HGB 10.5* 9.4*  HCT 31.9* 28.9*  PLT 143* 129*   BMET Recent Labs    01/15/19 0229 01/16/19 0333  NA 139 140  K 4.4 4.6  CL 107 107  CO2 22 26  GLUCOSE 126* 119*  BUN 25* 22  CREATININE 1.02 0.93  CALCIUM 8.1* 8.2*   PT/INR Recent Labs    01/13/19 1828  LABPROT 14.7  INR 1.2   CMP     Component Value Date/Time   NA 140 01/16/2019 0333   K 4.6 01/16/2019 0333   CL 107 01/16/2019 0333   CO2 26 01/16/2019 0333   GLUCOSE 119 (H) 01/16/2019 0333   BUN 22 01/16/2019 0333   CREATININE  0.93 01/16/2019 0333   CALCIUM 8.2 (L) 01/16/2019 0333   PROT 4.6 (L) 01/14/2019 0417   ALBUMIN 2.8 (L) 01/14/2019 0417   AST 58 (H) 01/14/2019 0417   ALT 40 01/14/2019 0417   ALKPHOS 45 01/14/2019 0417   BILITOT 0.6 01/14/2019 0417   GFRNONAA >60 01/16/2019 0333   GFRAA >60 01/16/2019 0333   Lipase  No results found for: LIPASE     Studies/Results: No results found.  Anti-infectives: Anti-infectives (From admission, onward)   None       Assessment/Plan Fallfrom ladder Rib fxR11/12andL 5/6- pain control/pulm toilet LUL pulmonary contusion- pulm toilet Rt flank/retroperitoneal hematoma- pain control. ABL anemia - Hgb 9.4 from 10.5, monitor R L1-L4 TPfxs, L1 inferior endplatefx, L4 compressionfx - per NS, TLSO when upright and OOB T2 and T3 SPfxs- pain control, no bracing needed Epidural hematoma from C1-2to C5-6 and C4 through upper T spine- c-spine clear. Holdanticoagulation and anti-platelet therapy at present. q2 neuro checks Elevated Creatinine- resolved RLE skin abrasion with exposed muscle- local wound care Hyperkalemia - resolved HTN - home avapro and lopressor HLD H/o CABG Chronic back pain - on tramadol and gabapentin at home Recent behavorial/cognitivechanges- ?dementia GERD - protonix  ID -none FEN - KVO IVF, reg diet, bowel regimen, add Boost VTE -SCDs only Foley - none Follow up- NS  Plan- Check CXR. Change oxycodone to tramadol. Continue  therapies.  CIR following.   LOS: 3 days    [facility] 01/16/2019, 7:45 AM Please see Amion for pager number during day hours 7:00am-4:30pm

## 2019-01-16 NOTE — Progress Notes (Signed)
Paged [facility] PAC again, to let her know that pt's  And swelling on right flank appear more pronounced this afternoon and pt's pain is not relieved by 1mg  iv dilaudid.

## 2019-01-16 NOTE — Progress Notes (Signed)
Santiago Glad, trauma RN came to room, she look at pt's right flank. She said it was swollen similarily on Friday, bruising has increased as expected.  She helped put braces on pt and get pt in chair.

## 2019-01-16 NOTE — Progress Notes (Signed)
Patient ID: Carlos Rowland, male   DOB: [birth year], 68 y.o.   MRN: [id number] BP 122/66 (BP Location: Right Arm)   Pulse 66   Temp 99.2 F (37.3 C) (Oral)   Resp 16   Wt 90.3 kg   SpO2 97%  Complaining of back pain, feels the oxycodone makes him nauseous Moving all extremities well No new recommendations

## 2019-01-17 ENCOUNTER — Inpatient Hospital Stay (HOSPITAL_COMMUNITY): Payer: PPO

## 2019-01-17 LAB — CBC
HCT: 28.4 % — ABNORMAL LOW (ref 39.0–52.0)
Hemoglobin: 9.2 g/dL — ABNORMAL LOW (ref 13.0–17.0)
MCH: 30.1 pg (ref 26.0–34.0)
MCHC: 32.4 g/dL (ref 30.0–36.0)
MCV: 92.8 fL (ref 80.0–100.0)
Platelets: 140 10*3/uL — ABNORMAL LOW (ref 150–400)
RBC: 3.06 MIL/uL — ABNORMAL LOW (ref 4.22–5.81)
RDW: 12.8 % (ref 11.5–15.5)
WBC: 8.5 10*3/uL (ref 4.0–10.5)
nRBC: 0 % (ref 0.0–0.2)

## 2019-01-17 MED ORDER — TRAMADOL HCL 50 MG PO TABS: 50.0000 mg | ORAL_TABLET | Freq: Four times a day (QID) | ORAL | Status: DC | PRN

## 2019-01-17 MED ORDER — ACETAMINOPHEN 500 MG PO TABS
1000.0000 mg | ORAL_TABLET | Freq: Four times a day (QID) | ORAL | Status: DC
  Administered 2019-01-17 – 2019-01-19 (×7): 1000 mg via ORAL
  Filled 2019-01-17 (×9): qty 2

## 2019-01-17 MED ORDER — METHOCARBAMOL 750 MG PO TABS
750.0000 mg | ORAL_TABLET | Freq: Four times a day (QID) | ORAL | Status: DC
  Administered 2019-01-17 – 2019-01-19 (×8): 750 mg via ORAL
  Filled 2019-01-17 (×8): qty 1

## 2019-01-17 MED ORDER — BISACODYL 10 MG RE SUPP
10.0000 mg | Freq: Once | RECTAL | Status: AC
  Administered 2019-01-17: 10:00:00 10 mg via RECTAL
  Filled 2019-01-17: qty 1

## 2019-01-17 MED ORDER — TRAMADOL HCL 50 MG PO TABS: 50.0000 mg | ORAL_TABLET | Freq: Four times a day (QID) | ORAL | Status: DC

## 2019-01-17 MED ORDER — OXYCODONE HCL 5 MG PO TABS
5.0000 mg | ORAL_TABLET | ORAL | Status: DC | PRN
  Administered 2019-01-17 (×2): 10 mg via ORAL
  Administered 2019-01-17 (×3): 5 mg via ORAL
  Administered 2019-01-18 (×2): 10 mg via ORAL
  Administered 2019-01-19: 5 mg via ORAL
  Filled 2019-01-17 (×2): qty 1
  Filled 2019-01-17: qty 2
  Filled 2019-01-17 (×2): qty 1
  Filled 2019-01-17 (×3): qty 2

## 2019-01-17 NOTE — TOC Initial Note (Signed)
Transition of Care [facility]) - Initial/Assessment Note    Patient Details  Name: Carlos Rowland MRN: [id number] Date of Birth: December 27, 1950  [facility] of [facility] [place]) CM/SW Contact:    Alexander [place], [place] Phone Number: 01/17/2019, 2:52 PM  Clinical Narrative:                 CSW spoke with pt wife via telephone [phone]) at her request after discussions with CIR determined that pt/pt wife would like SNF at discharge. CSW introduced self, role, reason for call. Pt wife confirms pt from home with her support. Confirmed home address, PCP is [facility], [place] at Dr. Willette Pa office in [place]. Pt wife prefers Architectural technologist and is in agreement for referral to be sent there through hub. Will initiate insurance approval tomorrow with [employer] and reach out to Olivia Mackie, liaison at [employer] today to review referral.    Expected Discharge Plan: Skilled Nursing Facility Barriers to Discharge: Ship broker, Continued Medical Work up   Patient Goals and CMS Choice Patient states their goals for this hospitalization and ongoing recovery are:: for him to go to [employer].gov Compare Post Acute Care list provided to:: Patient Represenative (must comment)(pt wife) Choice offered to / list presented to : Spouse  Expected Discharge Plan and Services Expected Discharge Plan: [place] In-house Referral: Clinical Social Work Discharge Planning Services: CM Consult Post Acute Care Choice: [place] Living arrangements for the past 2 months: [place]    Prior Living Arrangements/Services Living arrangements for the past 2 months: Single Family Home Lives with:: Spouse Patient language and need for interpreter reviewed:: Yes(no needs) Do you feel safe going back to the place where you live?: Yes      Need for Family Participation in Patient Care: Yes (Comment)(supervision/assistance with ADL/IADLs) Care giver support system in place?: Yes  (comment)(pt spouse) Current home services: DME Criminal Activity/Legal Involvement Pertinent to Current Situation/Hospitalization: No - Comment as needed  Activities of Daily Living      Permission Sought/Granted Permission sought to share information with : Facility Sport and exercise psychologist, Family Supports Permission granted to share information with : Yes, Verbal Permission Granted  Share Information with NAME: Suede Monigold  Permission granted to share info w AGENCY: [place] granted to share info w Relationship: spouse  Permission granted to share info w Contact Information: [phone]  Emotional Assessment Appearance:: Other (Comment Required(assessment completed with pt wife) Attitude/Demeanor/Rapport: Other (comment)(assessment completed with pt wife) Affect (typically observed): Other (comment)(assessment completed with pt wife) Orientation: : Oriented to Self, Oriented to Place, Oriented to  Time, Oriented to Situation, Fluctuating Orientation (Suspected and/or reported Sundowners) Alcohol / Substance Use: Not Applicable Psych Involvement: No (comment)  Admission diagnosis:  Fall from ladder, initial encounter [W11.XXXA] Closed fracture of spinous process of thoracic vertebra, initial encounter ([place]) [S22.008A] Closed fracture of transverse process of lumbar vertebra, initial encounter ([place]) [S32.009A] Closed fracture of multiple ribs of right side, initial encounter [S22.41XA] Multiple fractures of ribs, left side, initial encounter for closed fracture [S22.42XA] Subdural hematoma of neuraxis ([place]) [S06.5X9A] Compression fracture of lumbar vertebra, initial encounter, unspecified lumbar vertebral level ([place]) [S32.000A] Patient Active Problem List   Diagnosis Date Noted  . Fall 01/13/2019   PCP:  Patient, No Pcp Per Pharmacy:   [id number] - [place], [place] [address] Phone: [phone]  Fax: [phone]     Social Determinants of Health (SDOH) Interventions  Readmission Risk Interventions Readmission Risk Prevention Plan 01/17/2019  Post Dischage Appt Not Complete  Appt Comments plan for snf  Medication Screening Complete  Transportation Screening Complete

## 2019-01-17 NOTE — Progress Notes (Signed)
PT Cancellation Note  Patient Details Name: Carlos Rowland MRN: [id number] DOB: 1951/01/14   Cancelled Treatment:    Reason Eval/Treat Not Completed: (P) Other (comment)(Pt speaking with admission coordinator on arrival at 1407 returned at 1433 and nurse had just medicated pt and assisted him back to bed.  Pt reports to return in an hour for PT session.  Will f/u as time permits per POC.)   Gracelin Weisberg Eli Hose 01/17/2019, 2:37 PM [place] Stann [place], PTA Acute Rehabilitation Services Pager [phone] Office [phone]

## 2019-01-17 NOTE — Care Management Important Message (Signed)
Important Message  Patient Details  Name: Carlos Rowland MRN: [id number] Date of Birth: 25-Mar-1950   Medicare Important Message Given:  Yes     Orbie Pyo 01/17/2019, 3:26 PM

## 2019-01-17 NOTE — NC FL2 (Addendum)
[place] LEVEL OF CARE SCREENING TOOL     IDENTIFICATION  Patient Name: Carlos Rowland Birthdate: 1950-10-23 Sex: male Admission Date (Current Location): 01/13/2019  [facility] and [place] Number:  [employer] and Address:  The [place], [place] [address] [place], [place] [address]      Provider Number: [id number]  Attending Physician Name and Address:  Md, Trauma, MD  Relative Name and Phone Number:       Current Level of Care: Hospital Recommended Level of Care: [place] Prior Approval Number:    Date Approved/Denied:   PASRR Number: [id number]  Discharge Plan: SNF    Current Diagnoses: Patient Active Problem List   Diagnosis Date Noted  . Fall 01/13/2019    Orientation RESPIRATION BLADDER Height & Weight     Self, Time, Situation, Place  Normal, O2(1L nasal canula) Continent Weight: 199 lb 1.2 oz (90.3 kg) Height:     BEHAVIORAL SYMPTOMS/MOOD NEUROLOGICAL BOWEL NUTRITION STATUS      Continent Diet(see discharge summary)  AMBULATORY STATUS COMMUNICATION OF NEEDS Skin   Extensive Assist Verbally Bruising, Skin abrasions, Other (Comment)(abrasions on foot/hand/head; ecchymosis on right flank; wound on right lower extremity with impregnated gauze; wound on right arm with gauze)                       Personal Care Assistance Level of Assistance  Dressing, Feeding, Bathing Bathing Assistance: Maximum assistance Feeding assistance: Independent Dressing Assistance: Maximum assistance     Functional Limitations Info  Speech, Hearing, Sight Sight Info: Adequate Hearing Info: Adequate Speech Info: Adequate    SPECIAL CARE FACTORS FREQUENCY  OT (By licensed OT), PT (By licensed PT)     PT Frequency: 5x week OT Frequency: 5x week            Contractures Contractures Info: Not present    Additional Factors Info  Code Status, Allergies, Psychotropic Code Status Info: Full Code Allergies  Info: No Known Allergies Psychotropic Info: escitalopram (LEXAPRO) tablet 20 mg 2x daily         Current Medications (01/17/2019):  This is the current hospital active medication list Current Facility-Administered Medications  Medication Dose Route Frequency Provider Last Rate Last Dose  . 0.9 %  sodium chloride infusion   Intravenous Continuous Meuth, Brooke A, PA-C 10 mL/hr at 01/16/19 1047    . acetaminophen (TYLENOL) tablet 1,000 mg  1,000 mg Oral Q6H Saverio Danker, PA-C      . bisacodyl (DULCOLAX) suppository 10 mg  10 mg Rectal Daily PRN Meuth, Brooke A, PA-C      . docusate sodium (COLACE) capsule 100 mg  100 mg Oral BID Greer Pickerel, MD   100 mg at 01/17/19 [id number]  . escitalopram (LEXAPRO) tablet 20 mg  20 mg Oral BID Focht, Jessica L, PA   20 mg at 01/17/19 0833  . feeding supplement (BOOST / RESOURCE BREEZE) liquid 1 Container  1 Container Oral BID BM Meuth, Brooke A, PA-C   1 Container at 01/17/19 [phone]  . gabapentin (NEURONTIN) capsule 200 mg  200 mg Oral BID Greer Pickerel, MD   200 mg at 01/17/19 [id number]  . HYDROmorphone (DILAUDID) injection 1 mg  1 mg Intravenous Q4H PRN Meuth, Brooke A, PA-C   1 mg at 01/16/19 1453  . irbesartan (AVAPRO) tablet 37.5 mg  37.5 mg Oral Daily Focht, Jessica L, PA   37.5 mg at 01/16/19 1046  . methocarbamol (ROBAXIN)  tablet 750 mg  750 mg Oral Q6H Saverio Danker, PA-C      . metoprolol tartrate (LOPRESSOR) tablet 12.5 mg  12.5 mg Oral BID Greer Pickerel, MD   12.5 mg at 01/17/19 [id number]  . ondansetron (ZOFRAN-ODT) disintegrating tablet 4 mg  4 mg Oral Q6H PRN Greer Pickerel, MD   4 mg at 01/17/19 1428   Or  . ondansetron (ZOFRAN) injection 4 mg  4 mg Intravenous Q6H PRN Greer Pickerel, MD   4 mg at 01/17/19 0908  . oxyCODONE (Oxy IR/ROXICODONE) immediate release tablet 5-10 mg  5-10 mg Oral Q4H PRN Saverio Danker, PA-C   5 mg at 01/17/19 1436  . pantoprazole (PROTONIX) EC tablet 40 mg  40 mg Oral Daily Greer Pickerel, MD   40 mg at 01/17/19 [id number]  . polyethylene  glycol (MIRALAX / GLYCOLAX) packet 17 g  17 g Oral Daily Meuth, Brooke A, PA-C   17 g at 01/17/19 [id number]  . promethazine (PHENERGAN) injection 12.5 mg  12.5 mg Intravenous Q6H PRN Meuth, Brooke A, PA-C      . traMADol (ULTRAM) tablet 50-100 mg  50-100 mg Oral Q6H PRN Saverio Danker, PA-C         Discharge Medications: Please see discharge summary for a list of discharge medications.  Relevant Imaging Results:  Relevant Lab Results:   Additional Information [id number] [place], [place]

## 2019-01-17 NOTE — Progress Notes (Signed)
Spoke with Saverio Danker, PAC, she said it is fine to hold the Avapro due to bp of 107/65.

## 2019-01-17 NOTE — Progress Notes (Signed)
Patient ID: Carlos Rowland, male   DOB: 1950/10/12, 68 y.o.   MRN: [id number]       Subjective: Patient looks frail sitting up in his chair.  Not eating much.  Nauseated with pills on an empty stomach.  Unable to move well without IV pain meds.  Passing some flatus, but not much.  No BM.  C/o a lot of pain in his chest and back.  ROS: See above, otherwise other systems negative  Objective: Vital signs in last 24 hours: Temp:  [98 F (36.7 C)-99.3 F (37.4 C)] 98 F (36.7 C) (11/09 0800) Pulse Rate:  [65-98] 72 (11/09 0424) Resp:  [14-22] 18 (11/09 0816) BP: (108-126)/(66-79) 126/79 (11/09 0800) SpO2:  [66 %-100 %] 92 % (11/09 0818) Last BM Date: 01/13/19  Intake/Output from previous day: 11/08 0701 - 11/09 0700 In: 240 [P.O.:240] Out: 1350 [Urine:1350] Intake/Output this shift: No intake/output data recorded.  PE: Gen: appears in NAD, but does not appear to feel well at all Heart: regular Lungs: CTAB, but decrease BS at bases pulls around [phone] on IS Abd: unable to really examine due to TLSO brace in place MS: MAE, NVI, dressings in place over RUE and RLE skin tears Neuro: appropriate and grossly intact  Lab Results:  Recent Labs    01/16/19 0333 01/17/19 0312  WBC 8.6 8.5  HGB 9.4* 9.2*  HCT 28.9* 28.4*  PLT 129* 140*   BMET Recent Labs    01/15/19 0229 01/16/19 0333  NA 139 140  K 4.4 4.6  CL 107 107  CO2 22 26  GLUCOSE 126* 119*  BUN 25* 22  CREATININE 1.02 0.93  CALCIUM 8.1* 8.2*   PT/INR No results for input(s): LABPROT, INR in the last 72 hours. CMP     Component Value Date/Time   NA 140 01/16/2019 0333   K 4.6 01/16/2019 0333   CL 107 01/16/2019 0333   CO2 26 01/16/2019 0333   GLUCOSE 119 (H) 01/16/2019 0333   BUN 22 01/16/2019 0333   CREATININE 0.93 01/16/2019 0333   CALCIUM 8.2 (L) 01/16/2019 0333   PROT 4.6 (L) 01/14/2019 0417   ALBUMIN 2.8 (L) 01/14/2019 0417   AST 58 (H) 01/14/2019 0417   ALT 40 01/14/2019 0417   ALKPHOS 45  01/14/2019 0417   BILITOT 0.6 01/14/2019 0417   GFRNONAA >60 01/16/2019 0333   GFRAA >60 01/16/2019 0333   Lipase  No results found for: LIPASE     Studies/Results: Dg Chest Port 1 View  Result Date: 01/16/2019 CLINICAL DATA:  Status update, status post fall with shortness of breath EXAM: PORTABLE CHEST 1 VIEW COMPARISON:  01/13/2019 FINDINGS: Bilateral interstitial thickening with patchy left lung alveolar airspace opacities. Small left pleural effusion. Possible trace right pleural effusion. No pneumothorax. Stable cardiomediastinal silhouette. Prior CABG. Bilateral rib fractures better visualized on recent CT chest. IMPRESSION: 1. Bilateral interstitial thickening with patchy left lung alveolar airspace opacities which may reflect interstitial edema versus contusion. Attention on follow-up is recommended. Electronically Signed   By: Kathreen Devoid   On: 01/16/2019 14:42    Anti-infectives: Anti-infectives (From admission, onward)   None       Assessment/Plan Fallfrom ladder Rib fxR11/12andL 5/6- pain control/pulm toilet LUL pulmonary contusion- pulm toilet, some hypoxia yesterday, follow up CXR pending Rt flank/retroperitoneal hematoma- pain control. ABL anemia - stable R L1-L4 TPfxs, L1 inferior endplatefx, L4 compressionfx - per NS, TLSO when upright and OOB T2 and T3 SPfxs- pain control, no bracing needed  Epidural hematoma from C1-2to C5-6 and C4 through upper T spine-c-spine clear. Holdanticoagulation and anti-platelet therapy at present. q4 neuro checks Elevated Creatinine-resolved RLE skin abrasion with exposed muscle- local wound care Hyperkalemia -resolved HTN- home avapro and lopressor HLD H/o CABG Chronic back pain - on tramadol and gabapentin at home Recent behavorial/cognitivechanges- ?dementia GERD- protonix  ID -none FEN - KVO IVF, reg diet, bowel regimen, add Boost, miralax, suppository today.  ? Mild ileus VTE -SCDs only Foley  -none Follow up-NS  Plan-Check CXR. Adjust pain meds, tx to 4NP for further trauma care   LOS: 4 days    Henreitta Cea , [facility] Surgery 01/17/2019, 9:13 AM Please see Amion for pager number during day hours 7:00am-4:30pm

## 2019-01-17 NOTE — Progress Notes (Signed)
Physical Therapy Treatment Patient Details Name: Carlos Rowland MRN: [id number] DOB: 03-Nov-1950 Today's Date: 01/17/2019    History of Present Illness Pt is a 68 y.o. M with significant PMH of CABG, HTN, chronic back pain, recent fall off ladder with rib fx who presents after a fall off a ladder with rib fractures R 11/2, L 5/6, LUL pulmonary contusion, Rt flank hematoma, R L1-L4 TP fxs, L1 inf endplate fx, L4 compr fx, T2 and T3 SP fxs, epidural hematoma from C1-2 to C5-6 and C4 through upper T spine, RLE skin abrasian with exposed muscle.    PT Comments    Pt performed gt training and functional mobility and able to advance to gt in halls.  He continues to present with pain and weakness and would benefit from aggressive therapies at [facility] for functional gains before returning home.  Will plan for follow up 5x/wk to progress patient in preparation for d/c.      Follow Up Recommendations  CIR     Equipment Recommendations  Rolling walker with 5" wheels;3in1 (PT)    Recommendations for Other Services       Precautions / Restrictions Precautions Precautions: Fall;Back Precaution Booklet Issued: No Required Braces or Orthoses: Spinal Brace Spinal Brace: Thoracolumbosacral orthotic;Other (comment)("on when OOB") Restrictions Weight Bearing Restrictions: No    Mobility  Bed Mobility Overal bed mobility: Needs Assistance Bed Mobility: Rolling Rolling: Mod assist Sidelying to sit: Mod assist       General bed mobility comments: Pt performed rolling to L side and sidelying to sit.  He required moderate assistance to roll, advance LEs to edge of bed and to elevate trunk into sitting.  Transfers Overall transfer level: Needs assistance Equipment used: Rolling walker (2 wheeled) Transfers: Sit to/from Stand Sit to Stand: Min guard         General transfer comment: Cues for hand placement to and from seated surface.  Significant increase in time noted with flexed B  LEs.  Ambulation/Gait Ambulation/Gait assistance: Min assist Gait Distance (Feet): 50 Feet Assistive device: Rolling walker (2 wheeled) Gait Pattern/deviations: Step-through pattern;Trunk flexed;Shuffle;Decreased stride length     General Gait Details: Cues for upper trunk control and increased stride length.  As he fatigues more shuffling noted and pushing RW too far forward.   Stairs             Wheelchair Mobility    Modified Rankin (Stroke Patients Only)       Balance Overall balance assessment: Needs assistance Sitting-balance support: Feet supported Sitting balance-Leahy Scale: Good       Standing balance-Leahy Scale: Poor Standing balance comment: reliant on external support                            Cognition   Behavior During Therapy: WFL for tasks assessed/performed Overall Cognitive Status: Within Functional Limits for tasks assessed                                 General Comments: WFL for basic ADL and mobility, will need higher cognitive assessment      Exercises      General Comments        Pertinent Vitals/Pain Pain Assessment: Faces Faces Pain Scale: Hurts little more Pain Location: R flank Pain Descriptors / Indicators: Grimacing;Guarding Pain Intervention(s): Monitored during session;Repositioned    Home Living  Prior Function            PT Goals (current goals can now be found in the care plan section) Acute Rehab PT Goals Patient Stated Goal: "less pain." Potential to Achieve Goals: Good Progress towards PT goals: Progressing toward goals    Frequency    Min 5X/week      PT Plan Current plan remains appropriate    Co-evaluation              AM-PAC PT "6 Clicks" Mobility   Outcome Measure  Help needed turning from your back to your side while in a flat bed without using bedrails?: A Lot Help needed moving from lying on your back to sitting on the  side of a flat bed without using bedrails?: A Lot Help needed moving to and from a bed to a chair (including a wheelchair)?: A Lot Help needed standing up from a chair using your arms (e.g., wheelchair or bedside chair)?: A Lot Help needed to walk in hospital room?: A Lot Help needed climbing 3-5 steps with a railing? : Total 6 Click Score: 11    End of Session Equipment Utilized During Treatment: Gait belt;Back brace Activity Tolerance: Patient tolerated treatment well;Patient limited by pain Patient left: in chair;with call bell/phone within reach;with chair alarm set Nurse Communication: Mobility status PT Visit Diagnosis: Pain;Difficulty in walking, not elsewhere classified (R26.2) Pain - Right/Left: Right Pain - part of body: (flank and back)     Time: [id number] PT Time Calculation (min) (ACUTE ONLY): 23 min  Charges:  $Gait Training: 8-22 mins $Therapeutic Activity: 8-22 mins                     Governor Rooks, PTA Acute Rehabilitation Services Pager [phone] Office [phone]     Harvir Patry Eli Hose 01/17/2019, 4:33 PM

## 2019-01-17 NOTE — Consult Note (Signed)
Inpatient Rehab Admissions:  Inpatient Rehab Consult received.  I met with pt at the bedside for rehabilitation assessment. Spoke with pt about CIR program and what this type of program wound entail. Pt did not seem to be interested in CIR at this time. With his permission, I spoke to his wife via phone. After lengthy discussion regarding differences between SNF and CIR as well as expectations for participation, the wife has chosen SNF for rehab. At this time, [facility] will sign off and communicate family preference to [facility] team.    Please call if questions.   Jhonnie Garner, OTR/L  Rehab Admissions Coordinator  [phone] 01/17/2019 2:35 PM

## 2019-01-18 LAB — SARS CORONAVIRUS 2 (TAT 6-24 HRS): SARS Coronavirus 2: NEGATIVE

## 2019-01-18 NOTE — TOC Progression Note (Signed)
Transition of Care [facility]) - Progression Note    Patient Details  Name: MAKYLE JASKOLKA MRN: [id number] Date of Birth: 1950/12/01  Transition of Care [facility]) CM/SW [place], [place] Phone Number: 01/18/2019, 10:58 AM  Clinical Narrative:    CSW spoke with pt wife Wells Guiles via telephone, she is amenable to [facility]. CSW cleared up some confusion as pt wife was under the impression that pt would be going to both CIR first then [facility]. She now understands that pt will be at [facility] for rehab and is good with that plan.   Insurance approval started with [employer] (explained copays with this plan to pt wife), and requested PA Claiborne Billings order a new COVID.   Continue to follow.    Expected Discharge Plan: [place] Barriers to Discharge: Ship broker, Continued Medical Work up  Expected Discharge Plan and Services Expected Discharge Plan: [place] In-house Referral: Clinical Social Work Discharge Planning Services: CM Consult Post Acute Care Choice: [place] Living arrangements for the past 2 months: Single Family Home    Social Determinants of Health (SDOH) Interventions    Readmission Risk Interventions Readmission Risk Prevention Plan 01/17/2019  Post Dischage Appt Not Complete  Appt Comments plan for snf  Medication Screening Complete  Transportation Screening Complete

## 2019-01-18 NOTE — Progress Notes (Signed)
Patient ID: Carlos Rowland, male   DOB: 1950-07-23, 68 y.o.   MRN: [id number]       Subjective: Patient sleeping.  Awoke and states he feels his pain is fairly well controlled.  Most of his pain is in his low back.  Hasn't eaten much but is passing flatus.  No BM.  ROS: See above, otherwise other systems negative  Objective: Vital signs in last 24 hours: Temp:  [99.1 F (37.3 C)-99.6 F (37.6 C)] 99.1 F (37.3 C) (11/10 0544) Pulse Rate:  [73-87] 83 (11/10 0544) Resp:  [17] 17 (11/09 1226) BP: (107-120)/(61-65) 120/61 (11/10 0544) SpO2:  [89 %-94 %] 90 % (11/10 0544) Last BM Date: 01/13/19  Intake/Output from previous day: 11/09 0701 - 11/10 0700 In: 1320 [P.O.:1320] Out: -  Intake/Output this shift: No intake/output data recorded.  PE: Gen: sleepy Heart: regular Lungs: CTAB, some chest wall tenderness Abd: soft, NT, ND, +BS, right flank ecchymosis/hematoma present, but doesn't seem too tender Ext: skin tear on UE wrapped.  RLE wound is clean and with xeroform present.  No evidence of infection.  Lab Results:  Recent Labs    01/16/19 0333 01/17/19 0312  WBC 8.6 8.5  HGB 9.4* 9.2*  HCT 28.9* 28.4*  PLT 129* 140*   BMET Recent Labs    01/16/19 0333  NA 140  K 4.6  CL 107  CO2 26  GLUCOSE 119*  BUN 22  CREATININE 0.93  CALCIUM 8.2*   PT/INR No results for input(s): LABPROT, INR in the last 72 hours. CMP     Component Value Date/Time   NA 140 01/16/2019 0333   K 4.6 01/16/2019 0333   CL 107 01/16/2019 0333   CO2 26 01/16/2019 0333   GLUCOSE 119 (H) 01/16/2019 0333   BUN 22 01/16/2019 0333   CREATININE 0.93 01/16/2019 0333   CALCIUM 8.2 (L) 01/16/2019 0333   PROT 4.6 (L) 01/14/2019 0417   ALBUMIN 2.8 (L) 01/14/2019 0417   AST 58 (H) 01/14/2019 0417   ALT 40 01/14/2019 0417   ALKPHOS 45 01/14/2019 0417   BILITOT 0.6 01/14/2019 0417   GFRNONAA >60 01/16/2019 0333   GFRAA >60 01/16/2019 0333   Lipase  No results found for: LIPASE      Studies/Results: Dg Chest Port 1 View  Result Date: 01/17/2019 CLINICAL DATA:  Hypoxia EXAM: PORTABLE CHEST 1 VIEW COMPARISON:  Portable exam 0926 hours compared to 01/13/2019 FINDINGS: Brace projects over chest and abdomen. Enlargement of cardiac silhouette. Mediastinal contours and pulmonary vascularity normal. Atherosclerotic calcification aorta. Scattered subsegmental atelectasis at the mid to lower lungs bilaterally. Tiny LEFT pleural effusion. Remaining lungs clear. No pneumothorax. Bones demineralized. IMPRESSION: Subsegmental atelectasis at the mid to lower lungs with tiny LEFT pleural effusion. Electronically Signed   By: Lavonia Dana M.D.   On: 01/17/2019 09:49   Dg Chest Port 1 View  Result Date: 01/16/2019 CLINICAL DATA:  Status update, status post fall with shortness of breath EXAM: PORTABLE CHEST 1 VIEW COMPARISON:  01/13/2019 FINDINGS: Bilateral interstitial thickening with patchy left lung alveolar airspace opacities. Small left pleural effusion. Possible trace right pleural effusion. No pneumothorax. Stable cardiomediastinal silhouette. Prior CABG. Bilateral rib fractures better visualized on recent CT chest. IMPRESSION: 1. Bilateral interstitial thickening with patchy left lung alveolar airspace opacities which may reflect interstitial edema versus contusion. Attention on follow-up is recommended. Electronically Signed   By: Kathreen Devoid   On: 01/16/2019 14:42    Anti-infectives: Anti-infectives (From admission, onward)  None       Assessment/Plan Fallfrom ladder Rib fxR11/12andL 5/6- pain control/pulm toilet LUL pulmonary contusion- pulm toilet, some hypoxia yesterday, follow up CXR pending Rt flank/retroperitoneal hematoma- pain control. ABL anemia - stable R L1-L4 TPfxs, L1 inferior endplatefx, L4 compressionfx - per NS, TLSO when upright and OOB T2 and T3 SPfxs- pain control, no bracing needed Epidural hematoma from C1-2to C5-6 and C4 through upper T  spine-c-spine clear. Holdanticoagulation and anti-platelet therapy at present. q4 neuro checks Elevated Creatinine-resolved RLE skin abrasion with exposed muscle- local wound care Hyperkalemia -resolved HTN- home avapro and lopressor HLD H/o CABG Chronic back pain - on tramadol and gabapentin at home Recent behavorial/cognitivechanges- ?dementia/OCD per wife GERD- protonix  ID -none FEN -KVO IVF, reg diet, bowel regimen, ensure VTE -SCDs only Foley -none Follow up-NS  Plan-SNF when bed available   LOS: 5 days    Henreitta Cea , [facility] Surgery 01/18/2019, 9:28 AM Please see Amion for pager number during day hours 7:00am-4:30pm

## 2019-01-18 NOTE — TOC Progression Note (Signed)
Transition of Care [facility]) - Progression Note    Patient Details  Name: Carlos Rowland MRN: [id number] Date of Birth: 09-12-1950  Transition of Care [facility]) CM/SW [place], [place] Phone Number: 01/18/2019, 3:01 PM  Clinical Narrative:    CSW has received auth from [facility] [employer] for 7 days [phone].    Expected Discharge Plan: Skilled Nursing Facility Barriers to Discharge: [place] Rosalie Gums), Continued Medical Work up  Expected Discharge Plan and Services Expected Discharge Plan: [place] In-house Referral: Clinical Social Work Discharge Planning Services: CM Consult Post Acute Care Choice: [place] Living arrangements for the past 2 months: Single Family Home   Social Determinants of Health (SDOH) Interventions    Readmission Risk Interventions Readmission Risk Prevention Plan 01/17/2019  Post Dischage Appt Not Complete  Appt Comments plan for snf  Medication Screening Complete  Transportation Screening Complete

## 2019-01-18 NOTE — Progress Notes (Signed)
Occupational Therapy Treatment Patient Details Name: Carlos Rowland MRN: [id number] DOB: 10-28-1950 Today's Date: 01/18/2019    History of present illness Pt is a 68 y.o. M with significant PMH of CABG, HTN, chronic back pain, recent fall off ladder with rib fx who presents after a fall off a ladder with rib fractures R 11/2, L 5/6, LUL pulmonary contusion, Rt flank hematoma, R L1-L4 TP fxs, L1 inf endplate fx, L4 compr fx, T2 and T3 SP fxs, epidural hematoma from C1-2 to C5-6 and C4 through upper T spine, RLE skin abrasian with exposed muscle.   OT comments  Upon arrival patient semi-supine in bed, agreeable to therapy. Patient requires increased time throughout session for all mobility due to guarding/pain. Patient min guard for safety with bed mobility and head of bed elevated. Brace donned while patient seated at edge of bed. Patient agreeable to functional ambulation, min A for safety with balance using rolling walker. Patient have minor lateral loss of balance when turning walker around edge of bed. Patient reports feeling fatigued after functional ambulation. Attempted room air after bed mobility pt desat to 88%. O2 donned for rest of session, post ambulation O@ at 92% on 2L.   Follow Up Recommendations  SNF    Equipment Recommendations  Other (comment)(to be determined at next venue)       Precautions / Restrictions Precautions Precautions: Fall;Back Precaution Booklet Issued: No Required Braces or Orthoses: Spinal Brace Spinal Brace: Thoracolumbosacral orthotic;Other (comment) Spinal Brace Comments: on when OOB Restrictions Weight Bearing Restrictions: No       Mobility Bed Mobility Overal bed mobility: Needs Assistance Bed Mobility: Rolling Rolling: Min guard         General bed mobility comments: head of bed elevated   Transfers Overall transfer level: Needs assistance Equipment used: Rolling walker (2 wheeled) Transfers: Sit to/from Stand Sit to Stand: Min  assist         General transfer comment: increased time and min A due to low surface    Balance Overall balance assessment: Needs assistance Sitting-balance support: Feet supported Sitting balance-Leahy Scale: Good     Standing balance support: Bilateral upper extremity supported Standing balance-Leahy Scale: Poor Standing balance comment: reliant on external support                           ADL either performed or assessed with clinical judgement   ADL Overall ADL's : Needs assistance/impaired                         Toilet Transfer: Ambulation;Minimal assistance Toilet Transfer Details (indicate cue type and reason): stood to [employer] using urinal Toileting- Clothing Manipulation and Hygiene: Sit to/from stand;Minimal assistance Toileting - Clothing Manipulation Details (indicate cue type and reason): managing gown     Functional mobility during ADLs: Rolling walker;Minimal assistance General ADL Comments: requires increased time for all tasks                Cognition Arousal/Alertness: Awake/alert Behavior During Therapy: WFL for tasks assessed/performed Overall Cognitive Status: Within Functional Limits for tasks assessed                                 General Comments: WFL for basic ADL and mobility, will need higher cognitive assessment              General  Comments attempted room air while seated EOB patient desat to 88%. O2 donned for ambulation, post activity O2 reads 92% on 2L O2    Pertinent Vitals/ Pain       Pain Assessment: 0-10 Pain Score: 8  Pain Location: R flank Pain Descriptors / Indicators: Grimacing;Guarding Pain Intervention(s): Limited activity within patient's tolerance;Monitored during session         Frequency  Min 2X/week        Progress Toward Goals  OT Goals(current goals can now be found in the care plan section)  Progress towards OT goals: Progressing toward goals  Acute Rehab  OT Goals Patient Stated Goal: "eat my jell-o" OT Goal Formulation: With patient Time For Goal Achievement: 01/29/19 Potential to Achieve Goals: Good ADL Goals Pt Will Perform Grooming: with modified independence Pt Will Perform Upper Body Dressing: with modified independence Pt Will Perform Lower Body Dressing: with modified independence;sit to/from stand;with adaptive equipment Pt Will Transfer to Toilet: with modified independence;stand pivot transfer Additional ADL Goal #1: Pt will demonstrate independence with energy conservation strategies during ADL and functional mobiltiy.  Plan Discharge plan updated       AM-PAC OT "6 Clicks" Daily Activity     Outcome Measure   Help from another person eating meals?: None Help from another person taking care of personal grooming?: A Little Help from another person toileting, which includes using toliet, bedpan, or urinal?: A Lot Help from another person bathing (including washing, rinsing, drying)?: A Lot Help from another person to put on and taking off regular upper body clothing?: A Lot Help from another person to put on and taking off regular lower body clothing?: A Lot 6 Click Score: 15    End of Session Equipment Utilized During Treatment: Back brace  OT Visit Diagnosis: Unsteadiness on feet (R26.81);Other abnormalities of gait and mobility (R26.89);Muscle weakness (generalized) (M62.81);Pain Pain - part of body: (Lower back)   Activity Tolerance Patient tolerated treatment well   Patient Left in chair;with call bell/phone within reach;with chair alarm set;with family/visitor present           Time: [id number] OT Time Calculation (min): 33 min  Charges: OT General Charges $OT Visit: 1 Visit OT Treatments $Self Care/Home Management : 23-37 mins  Shon Millet OT OT office: [place] 01/18/2019, 12:01 PM

## 2019-01-19 DIAGNOSIS — I119 Hypertensive heart disease without heart failure: Secondary | ICD-10-CM | POA: Diagnosis not present

## 2019-01-19 DIAGNOSIS — S065X9D Traumatic subdural hemorrhage with loss of consciousness of unspecified duration, subsequent encounter: Secondary | ICD-10-CM | POA: Diagnosis not present

## 2019-01-19 DIAGNOSIS — R4189 Other symptoms and signs involving cognitive functions and awareness: Secondary | ICD-10-CM | POA: Diagnosis not present

## 2019-01-19 DIAGNOSIS — R0902 Hypoxemia: Secondary | ICD-10-CM | POA: Diagnosis not present

## 2019-01-19 DIAGNOSIS — I10 Essential (primary) hypertension: Secondary | ICD-10-CM | POA: Diagnosis not present

## 2019-01-19 DIAGNOSIS — R262 Difficulty in walking, not elsewhere classified: Secondary | ICD-10-CM | POA: Diagnosis not present

## 2019-01-19 DIAGNOSIS — S80812D Abrasion, left lower leg, subsequent encounter: Secondary | ICD-10-CM | POA: Diagnosis not present

## 2019-01-19 DIAGNOSIS — T07XXXA Unspecified multiple injuries, initial encounter: Secondary | ICD-10-CM | POA: Diagnosis not present

## 2019-01-19 DIAGNOSIS — S2241XD Multiple fractures of ribs, right side, subsequent encounter for fracture with routine healing: Secondary | ICD-10-CM | POA: Diagnosis not present

## 2019-01-19 DIAGNOSIS — M2550 Pain in unspecified joint: Secondary | ICD-10-CM | POA: Diagnosis not present

## 2019-01-19 DIAGNOSIS — T148XXD Other injury of unspecified body region, subsequent encounter: Secondary | ICD-10-CM | POA: Diagnosis not present

## 2019-01-19 DIAGNOSIS — Z20828 Contact with and (suspected) exposure to other viral communicable diseases: Secondary | ICD-10-CM | POA: Diagnosis not present

## 2019-01-19 DIAGNOSIS — S27321A Contusion of lung, unilateral, initial encounter: Secondary | ICD-10-CM | POA: Diagnosis not present

## 2019-01-19 DIAGNOSIS — S62009D Unspecified fracture of navicular [scaphoid] bone of unspecified wrist, subsequent encounter for fracture with routine healing: Secondary | ICD-10-CM | POA: Diagnosis not present

## 2019-01-19 DIAGNOSIS — S22008D Other fracture of unspecified thoracic vertebra, subsequent encounter for fracture with routine healing: Secondary | ICD-10-CM | POA: Diagnosis not present

## 2019-01-19 DIAGNOSIS — Z7401 Bed confinement status: Secondary | ICD-10-CM | POA: Diagnosis not present

## 2019-01-19 DIAGNOSIS — S2243XA Multiple fractures of ribs, bilateral, initial encounter for closed fracture: Secondary | ICD-10-CM | POA: Diagnosis not present

## 2019-01-19 DIAGNOSIS — S2242XD Multiple fractures of ribs, left side, subsequent encounter for fracture with routine healing: Secondary | ICD-10-CM | POA: Diagnosis not present

## 2019-01-19 DIAGNOSIS — S32000D Wedge compression fracture of unspecified lumbar vertebra, subsequent encounter for fracture with routine healing: Secondary | ICD-10-CM | POA: Diagnosis not present

## 2019-01-19 DIAGNOSIS — W19XXXA Unspecified fall, initial encounter: Secondary | ICD-10-CM | POA: Diagnosis not present

## 2019-01-19 DIAGNOSIS — N183 Chronic kidney disease, stage 3 unspecified: Secondary | ICD-10-CM | POA: Diagnosis not present

## 2019-01-19 DIAGNOSIS — I2510 Atherosclerotic heart disease of native coronary artery without angina pectoris: Secondary | ICD-10-CM | POA: Diagnosis not present

## 2019-01-19 DIAGNOSIS — R5381 Other malaise: Secondary | ICD-10-CM | POA: Diagnosis not present

## 2019-01-19 DIAGNOSIS — S2243XD Multiple fractures of ribs, bilateral, subsequent encounter for fracture with routine healing: Secondary | ICD-10-CM | POA: Diagnosis not present

## 2019-01-19 MED ORDER — OXYCODONE HCL 5 MG PO TABS: 5.0000 mg | ORAL_TABLET | ORAL | 0 refills | Status: AC | PRN

## 2019-01-19 MED ORDER — POLYETHYLENE GLYCOL 3350 17 G PO PACK: 17.0000 g | PACK | Freq: Every day | ORAL | 0 refills | Status: AC

## 2019-01-19 MED ORDER — TRAMADOL HCL 50 MG PO TABS: 50.0000 mg | ORAL_TABLET | Freq: Four times a day (QID) | ORAL | 0 refills | Status: AC | PRN

## 2019-01-19 MED ORDER — METHOCARBAMOL 750 MG PO TABS: 750.0000 mg | ORAL_TABLET | Freq: Four times a day (QID) | ORAL | 0 refills | Status: AC

## 2019-01-19 NOTE — Social Work (Signed)
Clinical Social Worker facilitated patient discharge including contacting patient family and facility to confirm patient discharge plans.  Clinical information faxed to facility and family agreeable with plan.  CSW arranged ambulance transport via [facility] to [facility].  RN to call [phone] ext [phone]  with report prior to discharge.  Clinical Social Worker will sign off for now as social work intervention is no longer needed. Please consult [place] again if new need arises.  Westley Hummer, MSW, [place] Social Worker [phone]

## 2019-01-19 NOTE — Discharge Summary (Signed)
Patient ID: Carlos Rowland [id number] 02-09-1951 68 y.o.  Admit date: 01/13/2019 Discharge date: 01/19/2019  Admitting Diagnosis: S/p fall Right rib fx 11, 12 L rib fx 5,6 LUL pulmonary contusion Rt flank/retroperitoneal hematoma Rt TP fx L1-L4 L1 Vertebral body fx T2,3 spinous process fx Extra-axial hematoma along C2-C7 Elevated Creatinine  Possible dehydration RLE skin abrasion with exposed muscle Scattered abrasions/contusions HTN Recent behavorial changes/cognitive changes past year  Discharge Diagnosis Patient Active Problem List   Diagnosis Date Noted  . Fall 01/13/2019  S/p fall Right rib fx 11, 12 L rib fx 5,6 LUL pulmonary contusion Rt flank/retroperitoneal hematoma Rt TP fx L1-L4 L1 Vertebral body fx T2,3 spinous process fx Extra-axial hematoma along C2-C7 Elevated Creatinine  Possible dehydration RLE skin abrasion with exposed muscle Scattered abrasions/contusions HTN Recent behavorial changes/cognitive changes past year  Consultants Dr. Kathyrn Sheriff, NS  Reason for Admission: 68 year old male brought in as a level 2 trauma after falling off a ladder just prior to arrival.  He denies loss consciousness.  He states he was trimming some tree branches.  He complains of discomfort on his right posterior chest and right flank.  He complains of some right lower leg pain.  He denies any neck pain.  He denies any other abdominal pain.  He denies any other extremity pain.  He states that he does have some chronic weakness in his handgrips bilaterally.  He has prior history of falling with a rib fracture.  He denies any numbness or tingling in his extremities.  He denies any vision changes.  He denies any shortness of breath.  He apparently was requiring a nonrebreather to maintain his oxygen saturation when I was called for consultation but now he is on a nasal cannula with excellent vital signs  He is not on a blood thinner other than a baby aspirin  He has  some chronic back pain  He has had some behavioral changes and concerns over the past year.  I reviewed his outside medical record.  He has been evaluated by a neurologist as well as his primary care physician.  Wife manages his meds and now manages household finances  Procedures none  Hospital Course:  The patient was admitted for above injuries.  He had a follow up CXR which was stable after rib fractures with some pulmonary contusions but no evidence of pneumothoraces, etc.  He was given an IS and improved with pulmonary functioning as his pain was better controlled and he was mobilized more with therapies.   He was also evaluated by NS for numerous spinous injuries.  He was not felt to need a c-collar for the epidural cervical hematoma.  Anticoagulants were held, also secondary to his right flank hematoma as well.  He was placed in a TLSO brace for his other fractures.  PT/OT were consulted for mobilization.  Initially CIR was recommended but the wife preferred SNF.  This was pursued and a bed was found at [facility] in [place].  He was also noted to have several skin tears on his RLE and RUE.  These were treated with local wound care and should continue as outlined in the DC instruction section at the facility.  He otherwise remained medically stable during his admission.  His home medications were able to be resumed.  He did have a minimal appetite to start, but this had picked up some by the end of the stay.  He also had protein drinks ordered that he was drinking as well.  On HD 6, the patient was felt stable for DC to SNF.  Physical Exam: Gen: sitting up in bed awake, alert, and talkative today, more so than previous days. Heart: regular Lungs: CTAB Abd: soft, NT, ND, +BS Ext: all skin tears are able with no evidence of infection and dressed Neuro: grossly intact  Allergies as of 01/19/2019   No Known Allergies     Medication List    TAKE these medications   acetaminophen 500  MG tablet Commonly known as: TYLENOL Take 1,000 mg by mouth every 6 (six) hours as needed for mild pain.   escitalopram 20 MG tablet Commonly known as: LEXAPRO Take 20 mg by mouth 2 (two) times daily.   furosemide 20 MG tablet Commonly known as: LASIX Take 20 mg by mouth daily.   gabapentin 300 MG capsule Commonly known as: NEURONTIN Take 300-600 mg by mouth at bedtime. 600mg  at bedtime   loratadine 10 MG tablet Commonly known as: CLARITIN Take 10 mg by mouth daily as needed for allergies.   methocarbamol 750 MG tablet Commonly known as: ROBAXIN Take 1 tablet (750 mg total) by mouth every 6 (six) hours.   metoprolol tartrate 25 MG tablet Commonly known as: LOPRESSOR Take 12.5 mg by mouth 2 (two) times daily.   Mucus Relief DM 30-600 MG Tb12 Take 1 tablet by mouth 2 (two) times daily as needed for cough.   oxyCODONE 5 MG immediate release tablet Commonly known as: Oxy IR/ROXICODONE Take 1-2 tablets (5-10 mg total) by mouth every 4 (four) hours as needed for moderate pain.   pantoprazole 40 MG tablet Commonly known as: PROTONIX Take 40 mg by mouth daily.   polyethylene glycol 17 g packet Commonly known as: MIRALAX / GLYCOLAX Take 17 g by mouth daily.   promethazine 25 MG tablet Commonly known as: PHENERGAN Take 25 mg by mouth every 6 (six) hours as needed for nausea/vomiting.   rosuvastatin 40 MG tablet Commonly known as: CRESTOR Take 40 mg by mouth daily.   traMADol 50 MG tablet Commonly known as: ULTRAM Take 50 mg by mouth 2 (two) times daily as needed for moderate pain. What changed: Another medication with the same name was added. Make sure you understand how and when to take each.   traMADol 50 MG tablet Commonly known as: ULTRAM Take 1-2 tablets (50-100 mg total) by mouth every 6 (six) hours as needed for moderate pain or severe pain. What changed: You were already taking a medication with the same name, and this prescription was added. Make sure you  understand how and when to take each.   valsartan 40 MG tablet Commonly known as: DIOVAN Take 40 mg by mouth daily.         Contact information for follow-up providers    Consuella Lose, MD. Schedule an appointment as soon as possible for a visit in 3 week(s).   Specialty: Neurosurgery Why: Call to schedule an appointment for 3 weeks after discharge to follow up on spinous fractures and epidural hematoma Contact information: [address] [place] [address] [phone]            Contact information for after-discharge care    Destination    [facility] .   Service: Skilled Nursing Contact information: [place] [phone]                  Signed: Saverio Danker, [facility] 01/19/2019, 9:12  AM Please see Amion for pager number during day hours 7:00am-4:30pm

## 2019-01-19 NOTE — Discharge Instructions (Signed)
Xeroform or petroleum gauze to skin tears on upper and lower extremities daily and place dry dressings over them.  May shower with bandages removed TLSO brace when upright and out of bed How to Use a Back Brace  A back brace is a form-fitting device that wraps around your trunk to support your lower back, abdomen, and hips. You may need to wear a back brace to relieve back pain or to correct a medical condition related to the back, such as abnormal curvature of the spine (scoliosis). A back brace can maintain or correct the shape of the spine and prevent a spinal problem from getting worse. A back brace can also take pressure off the layers of tissue (disks) between the bones of the spine (vertebrae). You may need a back brace to keep your back and spine in place while you heal from an injury or recover from surgery. Back braces can be either plastic (rigid brace) or soft elastic (dynamic brace).  A rigid brace usually covers both the front and back of the entire upper body.  A soft brace may cover only the lower back and abdomen and may fasten with self-adhesive elastic straps. Your health care provider will recommend the proper brace for your needs and medical condition. What are the risks?  A back brace may not help if you do not wear it as directed by your health care provider. Be sure to wear the brace exactly as instructed in order to prevent further back problems.  Wearing the brace may be uncomfortable at first. You may have trouble sleeping with it on. It may also be hard for you to do certain activities while wearing it.  If the back brace is not worn as instructed, it may cause rubbing and pressure on your skin and may cause sores. How to use a back brace Different types of braces will have different instructions for use. Follow instructions from your health care provider about:  How to put on the brace.  When and how often to wear the brace. In some cases, braces may need to be  worn for long stretches of time. For example, a brace may need to be worn for 16-23 hours a day when used for scoliosis.  How to take off the brace.  Any safety tips you should follow when wearing the brace. This may include: ? Move carefully while wearing the brace. The brace restricts your movement and could lead to additional injuries. ? Use a cane or walker for support if you feel unsteady. ? Sit in high, firm chairs. It may be difficult to stand up from low, soft chairs. ? Check the skin around the brace every day. Tell your health care provider about any concerns. How to care for a back brace  Do not let the back brace get wet. Typically, you will remove the brace for bathing and then put it back on afterward.  If you have a rigid brace, be sure to store it in a safe place when you are not wearing it. This will help to prevent damage.  Clean or wash the back brace with mild soap and water as told by your health care provider. Contact a health care provider if:  Your brace gets damaged.  You have pain or discomfort when wearing the back brace.  Your back pain is getting worse or is not improving over time.  You notice redness or skin breakdown from wearing the brace. Summary  You may need to wear  a back brace to relieve back pain or to correct a medical condition related to the back, such as abnormal curvature of the spine (scoliosis).  Follow instructions as told by your health care provider about how to use the brace and how to take care of it.  A back brace may not help if you do not wear it as directed by your health care provider. Wear the brace exactly as instructed in order to prevent further back problems.  Move carefully while wearing the brace.  Contact a health care provider if you have pain or discomfort when wearing the back brace or your back pain is getting worse or is not improving over time. This information is not intended to replace advice given to you by  your health care provider. Make sure you discuss any questions you have with your health care provider. Document Released: 02/13/2011 Document Revised: 01/20/2018 Document Reviewed: 01/20/2018 Elsevier Patient Education  2020 [employer].

## 2019-01-19 NOTE — TOC Progression Note (Signed)
Transition of Care [facility]) - Progression Note    Patient Details  Name: Carlos Rowland MRN: [id number] Date of Birth: August 13, 1950  Transition of Care [facility]) CM/SW [place], [place] Phone Number: 01/19/2019, 8:54 AM  Clinical Narrative:    PASRR received- [id number] E COVID has resulted neg. Auth was received yesterday 11/10.  If pt stable for dc plan to go to [facility], will reach out to trauma team.    Expected Discharge Plan: [place] Barriers to Discharge: [place] Rosalie Gums), Continued Medical Work up  Expected Discharge Plan and Services Expected Discharge Plan: [place] In-house Referral: Clinical Social Work Discharge Planning Services: CM Consult Post Acute Care Choice: [place] Living arrangements for the past 2 months: Single Family Home     Social Determinants of Health (SDOH) Interventions    Readmission Risk Interventions Readmission Risk Prevention Plan 01/17/2019  Post Dischage Appt Not Complete  Appt Comments plan for snf  Medication Screening Complete  Transportation Screening Complete

## 2019-01-19 NOTE — Progress Notes (Signed)
SATURATION QUALIFICATIONS: (This note is used to comply with regulatory documentation for home oxygen)  Patient Saturations on Room Air at Rest = 96%  Patient Saturations on Room Air while Ambulating =84%  Patient Saturations on 2 Liters of oxygen while Ambulating = 94%  Please briefly explain why patient needs home oxygen: desat with activity.  Jarvis [facility] Acute Rehabilitation Services Pager [phone] Office [phone]

## 2019-01-19 NOTE — TOC Transition Note (Signed)
Transition of Care [facility]) - CM/SW Discharge Note   Patient Details  Name: DAYLON ROHLFS MRN: [id number] Date of Birth: 07-21-1950  Transition of Care [facility]) CM/SW Contact:  Alexander Mt, [place] Phone Number: 01/19/2019, 10:38 AM   Clinical Narrative:    CSW spoke with pt wife, updated her that per attending team pt stable for dc to [facility] as arranged today. All information sent to SNF. Pt wife requesting to come see pt prior to dc, she is aware of visitation guidelines at SNF. [facility] called for 1:30pm, pt wife requests care team call her, phone # provided to [facility] with trauma team.    Final next level of care: Skilled Nursing Facility Barriers to Discharge: Barriers Resolved   Patient Goals and CMS Choice Patient states their goals for this hospitalization and ongoing recovery are:: for him to go to [facility] CMS Medicare.gov Compare Post Acute Care list provided to:: Patient Represenative (must comment)(pt wife) Choice offered to / list presented to : Spouse  Discharge Placement PASRR number recieved: 01/18/19            Patient chooses bed at: [facility], [place] Patient to be transferred to facility by: [place] Name of family member notified: pt wife Wells Guiles Patient and family notified of of transfer: 01/19/19  Discharge Plan and Services In-house Referral: Clinical Social Work Discharge Planning Services: CM Consult Post Acute Care Choice: [place]           Social Determinants of Health (SDOH) Interventions     Readmission Risk Interventions Readmission Risk Prevention Plan 01/17/2019  Post Dischage Appt Not Complete  Appt Comments plan for snf  Medication Screening Complete  Transportation Screening Complete

## 2019-01-19 NOTE — Progress Notes (Signed)
Nsg Discharge Note  Report given to Christine at [facility]. Patient VSS and awaiting for [facility] for pick-up.   Admit Date:  01/13/2019 Discharge date: 01/19/2019   Carlos Rowland to be D/C'd Skilled nursing facility per MD order.  AVS completed.  Copy for chart, and copy for patient signed, and dated. Patient/caregiver able to verbalize understanding.  Discharge Medication: Allergies as of 01/19/2019   No Known Allergies     Medication List    TAKE these medications   acetaminophen 500 MG tablet Commonly known as: TYLENOL Take 1,000 mg by mouth every 6 (six) hours as needed for mild pain.   escitalopram 20 MG tablet Commonly known as: LEXAPRO Take 20 mg by mouth 2 (two) times daily.   furosemide 20 MG tablet Commonly known as: LASIX Take 20 mg by mouth daily.   gabapentin 300 MG capsule Commonly known as: NEURONTIN Take 300-600 mg by mouth at bedtime. 600mg  at bedtime   loratadine 10 MG tablet Commonly known as: CLARITIN Take 10 mg by mouth daily as needed for allergies.   methocarbamol 750 MG tablet Commonly known as: ROBAXIN Take 1 tablet (750 mg total) by mouth every 6 (six) hours.   metoprolol tartrate 25 MG tablet Commonly known as: LOPRESSOR Take 12.5 mg by mouth 2 (two) times daily.   Mucus Relief DM 30-600 MG Tb12 Take 1 tablet by mouth 2 (two) times daily as needed for cough.   oxyCODONE 5 MG immediate release tablet Commonly known as: Oxy IR/ROXICODONE Take 1-2 tablets (5-10 mg total) by mouth every 4 (four) hours as needed for moderate pain.   pantoprazole 40 MG tablet Commonly known as: PROTONIX Take 40 mg by mouth daily.   polyethylene glycol 17 g packet Commonly known as: MIRALAX / GLYCOLAX Take 17 g by mouth daily.   promethazine 25 MG tablet Commonly known as: PHENERGAN Take 25 mg by mouth every 6 (six) hours as needed for nausea/vomiting.   rosuvastatin 40 MG tablet Commonly known as: CRESTOR Take 40 mg by mouth daily.    traMADol 50 MG tablet Commonly known as: ULTRAM Take 50 mg by mouth 2 (two) times daily as needed for moderate pain. What changed: Another medication with the same name was added. Make sure you understand how and when to take each.   traMADol 50 MG tablet Commonly known as: ULTRAM Take 1-2 tablets (50-100 mg total) by mouth every 6 (six) hours as needed for moderate pain or severe pain. What changed: You were already taking a medication with the same name, and this prescription was added. Make sure you understand how and when to take each.   valsartan 40 MG tablet Commonly known as: DIOVAN Take 40 mg by mouth daily.       Discharge Assessment: Vitals:   01/19/19 0815 01/19/19 1203  BP: (!) 156/80 108/69  Pulse: 71 72  Resp: 20 (!) 22  Temp: 98.5 F (36.9 C) 98.7 F (37.1 C)  SpO2: 97% 100%   Skin clean, dry and intact without evidence of skin break down, no evidence of skin tears noted. IV catheter discontinued intact. Site without signs and symptoms of complications - no redness or edema noted at insertion site, patient denies c/o pain - only slight tenderness at site.  Dressing with slight pressure applied.  D/c Instructions-Education: Discharge instructions given to patient/family with verbalized understanding. D/c education completed with patient/family including follow up instructions, medication list, d/c activities limitations if indicated, with other d/c instructions as indicated  by MD - patient able to verbalize understanding, all questions fully answered. Patient instructed to return to ED, call 911, or call MD for any changes in condition.  Patient escorted via [place], and D/C home via private auto.  Erasmo Leventhal, RN 01/19/2019 2:12 PM

## 2019-01-19 NOTE — Progress Notes (Signed)
Physical Therapy Treatment Patient Details Name: Carlos Rowland MRN: [id number] DOB: 03/07/1951 Today's Date: 01/19/2019    History of Present Illness Pt is a 68 y.o. M with significant PMH of CABG, HTN, chronic back pain, recent fall off ladder with rib fx who presents after a fall off a ladder with rib fractures R 11/2, L 5/6, LUL pulmonary contusion, Rt flank hematoma, R L1-L4 TP fxs, L1 inf endplate fx, L4 compr fx, T2 and T3 SP fxs, epidural hematoma from C1-2 to C5-6 and C4 through upper T spine, RLE skin abrasian with exposed muscle.    PT Comments    Pt performed supine to sit and sit to supine for brace application, adjustment and education on use.  This am brace was too small and PT returned after speaking to [employer] rep to adjust brace correctly before leaving for SNF.  Pt receptive to education.    Follow Up Recommendations  SNF     Equipment Recommendations  Rolling walker with 5" wheels;3in1 (PT)    Recommendations for Other Services       Precautions / Restrictions Precautions Precautions: Fall;Back Precaution Booklet Issued: No Required Braces or Orthoses: Spinal Brace Spinal Brace: Thoracolumbosacral orthotic;Other (comment) Spinal Brace Comments: on when OOB Restrictions Weight Bearing Restrictions: No    Mobility  Bed Mobility Overal bed mobility: Needs Assistance Bed Mobility: Rolling Rolling: Mod assist;Min assist Sidelying to sit: Min assist     Sit to sidelying: Mod assist General bed mobility comments: Pt performed supine to sit with min assistance and mod assistance to return back to bed.  Educated on spinal precautions.  Once is sitting adjusted brace for accurate fit and educated patient and wife on use and application.  Transfers  Ambulation/Gait   Stairs             Wheelchair Mobility    Modified Rankin (Stroke Patients Only)       Balance Overall balance assessment: Needs assistance Sitting-balance support: Feet  supported Sitting balance-Leahy Scale: Good     Standing balance support: Bilateral upper extremity supported Standing balance-Leahy Scale: Poor Standing balance comment: reliant on external support                            Cognition Arousal/Alertness: Awake/alert Behavior During Therapy: WFL for tasks assessed/performed Overall Cognitive Status: Within Functional Limits for tasks assessed                                 General Comments: WFL for basic ADL and mobility, will need higher cognitive assessment      Exercises      General Comments        Pertinent Vitals/Pain Pain Assessment: 0-10 Pain Score: 7  Faces Pain Scale: Hurts little more Pain Location: R flank Pain Descriptors / Indicators: Grimacing;Guarding Pain Intervention(s): Repositioned    Home Living                      Prior Function            PT Goals (current goals can now be found in the care plan section) Acute Rehab PT Goals Patient Stated Goal: To rest Potential to Achieve Goals: Good Progress towards PT goals: Progressing toward goals    Frequency    Min 3X/week      PT Plan Current plan remains appropriate  Co-evaluation              AM-PAC PT "6 Clicks" Mobility   Outcome Measure  Help needed turning from your back to your side while in a flat bed without using bedrails?: A Lot Help needed moving from lying on your back to sitting on the side of a flat bed without using bedrails?: A Lot Help needed moving to and from a bed to a chair (including a wheelchair)?: A Little Help needed standing up from a chair using your arms (e.g., wheelchair or bedside chair)?: A Little Help needed to walk in hospital room?: A Little Help needed climbing 3-5 steps with a railing? : A Little 6 Click Score: 16    End of Session Equipment Utilized During Treatment: Gait belt;Back brace Activity Tolerance: Patient tolerated treatment well;Patient  limited by pain Patient left: in chair;with call bell/phone within reach;with chair alarm set Nurse Communication: Mobility status PT Visit Diagnosis: Pain;Difficulty in walking, not elsewhere classified (R26.2) Pain - Right/Left: Right Pain - part of body: (flank and back pain.)     Time: [id number] PT Time Calculation (min) (ACUTE ONLY): 11 min  Charges:  $Therapeutic Activity: 8-22 mins                     Erasmo Leventhal , PTA Acute Rehabilitation Services Pager [phone] Office [phone]     Lutisha Knoche Eli Hose 01/19/2019, 2:27 PM

## 2019-01-19 NOTE — Progress Notes (Signed)
Physical Therapy Treatment Patient Details Name: Carlos Rowland MRN: [id number] DOB: 04/19/1950 Today's Date: 01/19/2019    History of Present Illness Pt is a 68 y.o. M with significant PMH of CABG, HTN, chronic back pain, recent fall off ladder with rib fx who presents after a fall off a ladder with rib fractures R 11/2, L 5/6, LUL pulmonary contusion, Rt flank hematoma, R L1-L4 TP fxs, L1 inf endplate fx, L4 compr fx, T2 and T3 SP fxs, epidural hematoma from C1-2 to C5-6 and C4 through upper T spine, RLE skin abrasian with exposed muscle.    PT Comments    Pt limited this session due to need to have BM.  He performed bed mobility with moderate assistance and transfers and gt training with min guard assistance.  He wife preferred snf over CIR placement and CIR has signed off.  Will update recommendations to snf and inform supervising PT.  Plan is to d/c today to [facility].     Follow Up Recommendations  SNF     Equipment Recommendations       Recommendations for Other Services       Precautions / Restrictions Precautions Precautions: Fall;Back Precaution Booklet Issued: No Required Braces or Orthoses: Spinal Brace Spinal Brace: Thoracolumbosacral orthotic;Other (comment) Spinal Brace Comments: on when OOB Restrictions Weight Bearing Restrictions: No    Mobility  Bed Mobility Overal bed mobility: Needs Assistance Bed Mobility: Rolling Rolling: Mod assist Sidelying to sit: Mod assist       General bed mobility comments: Pt continues to require moderate assistance to move to his side and to elevate trunk into sitting.  He is guarded due to pain and presents with forward flexion of his spine in sitting.  He is not comfortable with brace application.  Transfers Overall transfer level: Needs assistance Equipment used: Rolling walker (2 wheeled) Transfers: Sit to/from Stand Sit to Stand: Min guard         General transfer comment: Cues for hand placement to and  from seated surface.  Pt is slow and guarded with flexed LEs.  Increased time to achieve erect stance.  Ambulation/Gait Ambulation/Gait assistance: Min assist Gait Distance (Feet): 10 Feet Assistive device: Rolling walker (2 wheeled) Gait Pattern/deviations: Step-through pattern;Shuffle;Trunk flexed     General Gait Details: Cues for upper trunk control and RW safety.  Pt did not make it far due to urge to sit and have BM.   Stairs             Wheelchair Mobility    Modified Rankin (Stroke Patients Only)       Balance Overall balance assessment: Needs assistance Sitting-balance support: Feet supported Sitting balance-Leahy Scale: Good     Standing balance support: Bilateral upper extremity supported Standing balance-Leahy Scale: Poor Standing balance comment: reliant on external support                            Cognition Arousal/Alertness: Awake/alert Behavior During Therapy: WFL for tasks assessed/performed Overall Cognitive Status: Within Functional Limits for tasks assessed                                        Exercises      General Comments        Pertinent Vitals/Pain Pain Assessment: 0-10 Pain Score: 7  Pain Location: R flank Pain Descriptors / Indicators: Grimacing;Guarding  Pain Intervention(s): Monitored during session;Repositioned    Home Living                      Prior Function            PT Goals (current goals can now be found in the care plan section) Acute Rehab PT Goals Patient Stated Goal: "to have a bowel movement" Potential to Achieve Goals: Good Progress towards PT goals: Progressing toward goals    Frequency    Min 3X/week      PT Plan Discharge plan needs to be updated    Co-evaluation              AM-PAC PT "6 Clicks" Mobility   Outcome Measure  Help needed turning from your back to your side while in a flat bed without using bedrails?: A Lot Help needed moving  from lying on your back to sitting on the side of a flat bed without using bedrails?: A Lot Help needed moving to and from a bed to a chair (including a wheelchair)?: A Little Help needed standing up from a chair using your arms (e.g., wheelchair or bedside chair)?: A Little Help needed to walk in hospital room?: A Little Help needed climbing 3-5 steps with a railing? : A Little 6 Click Score: 16    End of Session Equipment Utilized During Treatment: Gait belt;Back brace Activity Tolerance: Patient tolerated treatment well;Patient limited by pain Patient left: in chair;with call bell/phone within reach;with chair alarm set Nurse Communication: Mobility status PT Visit Diagnosis: Pain;Difficulty in walking, not elsewhere classified (R26.2) Pain - Right/Left: Right Pain - part of body: (flank and back pain.)     Time: [id number] PT Time Calculation (min) (ACUTE ONLY): 16 min  Charges:  $Gait Training: 8-22 mins                     Erasmo Leventhal , PTA Acute Rehabilitation Services Pager [phone] Office [phone]     Charlaine Utsey Eli Hose 01/19/2019, 11:05 AM

## 2019-01-24 DIAGNOSIS — N183 Chronic kidney disease, stage 3 unspecified: Secondary | ICD-10-CM | POA: Diagnosis not present

## 2019-01-24 DIAGNOSIS — I2510 Atherosclerotic heart disease of native coronary artery without angina pectoris: Secondary | ICD-10-CM | POA: Diagnosis not present

## 2019-01-24 DIAGNOSIS — R262 Difficulty in walking, not elsewhere classified: Secondary | ICD-10-CM | POA: Diagnosis not present

## 2019-01-24 DIAGNOSIS — I119 Hypertensive heart disease without heart failure: Secondary | ICD-10-CM | POA: Diagnosis not present

## 2019-02-02 DIAGNOSIS — Z87891 Personal history of nicotine dependence: Secondary | ICD-10-CM | POA: Diagnosis not present

## 2019-02-02 DIAGNOSIS — E785 Hyperlipidemia, unspecified: Secondary | ICD-10-CM | POA: Diagnosis not present

## 2019-02-02 DIAGNOSIS — Z951 Presence of aortocoronary bypass graft: Secondary | ICD-10-CM | POA: Diagnosis not present

## 2019-02-02 DIAGNOSIS — J449 Chronic obstructive pulmonary disease, unspecified: Secondary | ICD-10-CM | POA: Diagnosis not present

## 2019-02-02 DIAGNOSIS — K219 Gastro-esophageal reflux disease without esophagitis: Secondary | ICD-10-CM | POA: Diagnosis not present

## 2019-02-02 DIAGNOSIS — S22029D Unspecified fracture of second thoracic vertebra, subsequent encounter for fracture with routine healing: Secondary | ICD-10-CM | POA: Diagnosis not present

## 2019-02-02 DIAGNOSIS — I2510 Atherosclerotic heart disease of native coronary artery without angina pectoris: Secondary | ICD-10-CM | POA: Diagnosis not present

## 2019-02-02 DIAGNOSIS — F418 Other specified anxiety disorders: Secondary | ICD-10-CM | POA: Diagnosis not present

## 2019-02-02 DIAGNOSIS — Z9181 History of falling: Secondary | ICD-10-CM | POA: Diagnosis not present

## 2019-02-02 DIAGNOSIS — N189 Chronic kidney disease, unspecified: Secondary | ICD-10-CM | POA: Diagnosis not present

## 2019-02-02 DIAGNOSIS — G8929 Other chronic pain: Secondary | ICD-10-CM | POA: Diagnosis not present

## 2019-02-02 DIAGNOSIS — S065X9D Traumatic subdural hemorrhage with loss of consciousness of unspecified duration, subsequent encounter: Secondary | ICD-10-CM | POA: Diagnosis not present

## 2019-02-02 DIAGNOSIS — S22039D Unspecified fracture of third thoracic vertebra, subsequent encounter for fracture with routine healing: Secondary | ICD-10-CM | POA: Diagnosis not present

## 2019-02-02 DIAGNOSIS — Z79899 Other long term (current) drug therapy: Secondary | ICD-10-CM | POA: Diagnosis not present

## 2019-02-02 DIAGNOSIS — Z7982 Long term (current) use of aspirin: Secondary | ICD-10-CM | POA: Diagnosis not present

## 2019-02-02 DIAGNOSIS — S80812D Abrasion, left lower leg, subsequent encounter: Secondary | ICD-10-CM | POA: Diagnosis not present

## 2019-02-02 DIAGNOSIS — S2243XD Multiple fractures of ribs, bilateral, subsequent encounter for fracture with routine healing: Secondary | ICD-10-CM | POA: Diagnosis not present

## 2019-02-02 DIAGNOSIS — W11XXXD Fall on and from ladder, subsequent encounter: Secondary | ICD-10-CM | POA: Diagnosis not present

## 2019-02-02 DIAGNOSIS — I129 Hypertensive chronic kidney disease with stage 1 through stage 4 chronic kidney disease, or unspecified chronic kidney disease: Secondary | ICD-10-CM | POA: Diagnosis not present

## 2019-02-02 DIAGNOSIS — Z79891 Long term (current) use of opiate analgesic: Secondary | ICD-10-CM | POA: Diagnosis not present

## 2019-02-02 DIAGNOSIS — S32019D Unspecified fracture of first lumbar vertebra, subsequent encounter for fracture with routine healing: Secondary | ICD-10-CM | POA: Diagnosis not present

## 2019-02-02 DIAGNOSIS — M810 Age-related osteoporosis without current pathological fracture: Secondary | ICD-10-CM | POA: Diagnosis not present

## 2019-02-02 DIAGNOSIS — R5383 Other fatigue: Secondary | ICD-10-CM | POA: Diagnosis not present

## 2019-02-02 DIAGNOSIS — R2689 Other abnormalities of gait and mobility: Secondary | ICD-10-CM | POA: Diagnosis not present

## 2019-02-02 DIAGNOSIS — R0902 Hypoxemia: Secondary | ICD-10-CM | POA: Diagnosis not present

## 2019-02-07 DIAGNOSIS — S32010A Wedge compression fracture of first lumbar vertebra, initial encounter for closed fracture: Secondary | ICD-10-CM | POA: Diagnosis not present

## 2019-02-07 DIAGNOSIS — R937 Abnormal findings on diagnostic imaging of other parts of musculoskeletal system: Secondary | ICD-10-CM | POA: Diagnosis not present

## 2019-02-07 DIAGNOSIS — S22039D Unspecified fracture of third thoracic vertebra, subsequent encounter for fracture with routine healing: Secondary | ICD-10-CM | POA: Diagnosis not present

## 2019-02-07 DIAGNOSIS — S32009D Unspecified fracture of unspecified lumbar vertebra, subsequent encounter for fracture with routine healing: Secondary | ICD-10-CM | POA: Diagnosis not present

## 2019-02-07 DIAGNOSIS — S22029D Unspecified fracture of second thoracic vertebra, subsequent encounter for fracture with routine healing: Secondary | ICD-10-CM | POA: Diagnosis not present

## 2019-02-15 DIAGNOSIS — I10 Essential (primary) hypertension: Secondary | ICD-10-CM | POA: Diagnosis not present

## 2019-02-15 DIAGNOSIS — E538 Deficiency of other specified B group vitamins: Secondary | ICD-10-CM | POA: Diagnosis not present

## 2019-02-15 DIAGNOSIS — R110 Nausea: Secondary | ICD-10-CM | POA: Diagnosis not present

## 2019-02-15 DIAGNOSIS — R5381 Other malaise: Secondary | ICD-10-CM | POA: Diagnosis not present

## 2019-02-15 DIAGNOSIS — E782 Mixed hyperlipidemia: Secondary | ICD-10-CM | POA: Diagnosis not present

## 2019-02-15 DIAGNOSIS — S32018D Other fracture of first lumbar vertebra, subsequent encounter for fracture with routine healing: Secondary | ICD-10-CM | POA: Diagnosis not present

## 2019-02-15 DIAGNOSIS — R5383 Other fatigue: Secondary | ICD-10-CM | POA: Diagnosis not present

## 2019-03-04 DIAGNOSIS — K219 Gastro-esophageal reflux disease without esophagitis: Secondary | ICD-10-CM | POA: Diagnosis not present

## 2019-03-04 DIAGNOSIS — S22039D Unspecified fracture of third thoracic vertebra, subsequent encounter for fracture with routine healing: Secondary | ICD-10-CM | POA: Diagnosis not present

## 2019-03-04 DIAGNOSIS — N189 Chronic kidney disease, unspecified: Secondary | ICD-10-CM | POA: Diagnosis not present

## 2019-03-04 DIAGNOSIS — R5383 Other fatigue: Secondary | ICD-10-CM | POA: Diagnosis not present

## 2019-03-04 DIAGNOSIS — I129 Hypertensive chronic kidney disease with stage 1 through stage 4 chronic kidney disease, or unspecified chronic kidney disease: Secondary | ICD-10-CM | POA: Diagnosis not present

## 2019-03-04 DIAGNOSIS — S22029D Unspecified fracture of second thoracic vertebra, subsequent encounter for fracture with routine healing: Secondary | ICD-10-CM | POA: Diagnosis not present

## 2019-03-04 DIAGNOSIS — S80812D Abrasion, left lower leg, subsequent encounter: Secondary | ICD-10-CM | POA: Diagnosis not present

## 2019-03-04 DIAGNOSIS — S2243XD Multiple fractures of ribs, bilateral, subsequent encounter for fracture with routine healing: Secondary | ICD-10-CM | POA: Diagnosis not present

## 2019-03-04 DIAGNOSIS — S065X9D Traumatic subdural hemorrhage with loss of consciousness of unspecified duration, subsequent encounter: Secondary | ICD-10-CM | POA: Diagnosis not present

## 2019-03-04 DIAGNOSIS — E785 Hyperlipidemia, unspecified: Secondary | ICD-10-CM | POA: Diagnosis not present

## 2019-03-04 DIAGNOSIS — Z7982 Long term (current) use of aspirin: Secondary | ICD-10-CM | POA: Diagnosis not present

## 2019-03-04 DIAGNOSIS — Z87891 Personal history of nicotine dependence: Secondary | ICD-10-CM | POA: Diagnosis not present

## 2019-03-04 DIAGNOSIS — W11XXXD Fall on and from ladder, subsequent encounter: Secondary | ICD-10-CM | POA: Diagnosis not present

## 2019-03-04 DIAGNOSIS — R2689 Other abnormalities of gait and mobility: Secondary | ICD-10-CM | POA: Diagnosis not present

## 2019-03-04 DIAGNOSIS — M810 Age-related osteoporosis without current pathological fracture: Secondary | ICD-10-CM | POA: Diagnosis not present

## 2019-03-04 DIAGNOSIS — G8929 Other chronic pain: Secondary | ICD-10-CM | POA: Diagnosis not present

## 2019-03-04 DIAGNOSIS — R0902 Hypoxemia: Secondary | ICD-10-CM | POA: Diagnosis not present

## 2019-03-04 DIAGNOSIS — I2510 Atherosclerotic heart disease of native coronary artery without angina pectoris: Secondary | ICD-10-CM | POA: Diagnosis not present

## 2019-03-04 DIAGNOSIS — F418 Other specified anxiety disorders: Secondary | ICD-10-CM | POA: Diagnosis not present

## 2019-03-04 DIAGNOSIS — S32019D Unspecified fracture of first lumbar vertebra, subsequent encounter for fracture with routine healing: Secondary | ICD-10-CM | POA: Diagnosis not present

## 2019-03-04 DIAGNOSIS — Z79899 Other long term (current) drug therapy: Secondary | ICD-10-CM | POA: Diagnosis not present

## 2019-03-04 DIAGNOSIS — Z9181 History of falling: Secondary | ICD-10-CM | POA: Diagnosis not present

## 2019-03-04 DIAGNOSIS — Z951 Presence of aortocoronary bypass graft: Secondary | ICD-10-CM | POA: Diagnosis not present

## 2019-03-04 DIAGNOSIS — Z79891 Long term (current) use of opiate analgesic: Secondary | ICD-10-CM | POA: Diagnosis not present

## 2019-03-21 DIAGNOSIS — M8949 Other hypertrophic osteoarthropathy, multiple sites: Secondary | ICD-10-CM | POA: Diagnosis not present

## 2019-03-21 DIAGNOSIS — I10 Essential (primary) hypertension: Secondary | ICD-10-CM | POA: Diagnosis not present

## 2019-03-21 DIAGNOSIS — Z8781 Personal history of (healed) traumatic fracture: Secondary | ICD-10-CM | POA: Diagnosis not present

## 2019-03-23 DIAGNOSIS — S32010A Wedge compression fracture of first lumbar vertebra, initial encounter for closed fracture: Secondary | ICD-10-CM | POA: Diagnosis not present

## 2019-03-23 DIAGNOSIS — S22029D Unspecified fracture of second thoracic vertebra, subsequent encounter for fracture with routine healing: Secondary | ICD-10-CM | POA: Diagnosis not present

## 2019-03-23 DIAGNOSIS — S22039D Unspecified fracture of third thoracic vertebra, subsequent encounter for fracture with routine healing: Secondary | ICD-10-CM | POA: Diagnosis not present

## 2019-03-23 DIAGNOSIS — S32009D Unspecified fracture of unspecified lumbar vertebra, subsequent encounter for fracture with routine healing: Secondary | ICD-10-CM | POA: Diagnosis not present

## 2019-05-06 DIAGNOSIS — I252 Old myocardial infarction: Secondary | ICD-10-CM | POA: Diagnosis not present

## 2019-05-06 DIAGNOSIS — I2510 Atherosclerotic heart disease of native coronary artery without angina pectoris: Secondary | ICD-10-CM | POA: Diagnosis not present

## 2019-05-06 DIAGNOSIS — I10 Essential (primary) hypertension: Secondary | ICD-10-CM | POA: Diagnosis not present

## 2019-05-06 DIAGNOSIS — E782 Mixed hyperlipidemia: Secondary | ICD-10-CM | POA: Diagnosis not present

## 2019-05-19 DIAGNOSIS — I2510 Atherosclerotic heart disease of native coronary artery without angina pectoris: Secondary | ICD-10-CM | POA: Diagnosis not present

## 2019-05-19 DIAGNOSIS — F419 Anxiety disorder, unspecified: Secondary | ICD-10-CM | POA: Diagnosis not present

## 2019-05-19 DIAGNOSIS — R5381 Other malaise: Secondary | ICD-10-CM | POA: Diagnosis not present

## 2019-05-19 DIAGNOSIS — M48061 Spinal stenosis, lumbar region without neurogenic claudication: Secondary | ICD-10-CM | POA: Diagnosis not present

## 2019-05-19 DIAGNOSIS — R5383 Other fatigue: Secondary | ICD-10-CM | POA: Diagnosis not present

## 2019-05-19 DIAGNOSIS — E782 Mixed hyperlipidemia: Secondary | ICD-10-CM | POA: Diagnosis not present

## 2019-05-19 DIAGNOSIS — I252 Old myocardial infarction: Secondary | ICD-10-CM | POA: Diagnosis not present

## 2019-05-19 DIAGNOSIS — I10 Essential (primary) hypertension: Secondary | ICD-10-CM | POA: Diagnosis not present

## 2019-05-19 DIAGNOSIS — R945 Abnormal results of liver function studies: Secondary | ICD-10-CM | POA: Diagnosis not present

## 2019-05-19 DIAGNOSIS — Z79899 Other long term (current) drug therapy: Secondary | ICD-10-CM | POA: Diagnosis not present

## 2019-05-19 DIAGNOSIS — Z23 Encounter for immunization: Secondary | ICD-10-CM | POA: Diagnosis not present

## 2019-05-19 DIAGNOSIS — I739 Peripheral vascular disease, unspecified: Secondary | ICD-10-CM | POA: Diagnosis not present

## 2019-05-19 DIAGNOSIS — I83893 Varicose veins of bilateral lower extremities with other complications: Secondary | ICD-10-CM | POA: Diagnosis not present

## 2019-05-19 DIAGNOSIS — Z8781 Personal history of (healed) traumatic fracture: Secondary | ICD-10-CM | POA: Diagnosis not present

## 2019-05-19 DIAGNOSIS — M8949 Other hypertrophic osteoarthropathy, multiple sites: Secondary | ICD-10-CM | POA: Diagnosis not present

## 2019-05-19 DIAGNOSIS — M5136 Other intervertebral disc degeneration, lumbar region: Secondary | ICD-10-CM | POA: Diagnosis not present

## 2019-06-09 DIAGNOSIS — L570 Actinic keratosis: Secondary | ICD-10-CM | POA: Diagnosis not present

## 2019-08-22 DIAGNOSIS — I252 Old myocardial infarction: Secondary | ICD-10-CM | POA: Diagnosis not present

## 2019-08-22 DIAGNOSIS — E538 Deficiency of other specified B group vitamins: Secondary | ICD-10-CM | POA: Diagnosis not present

## 2019-08-22 DIAGNOSIS — I83893 Varicose veins of bilateral lower extremities with other complications: Secondary | ICD-10-CM | POA: Diagnosis not present

## 2019-08-22 DIAGNOSIS — M8949 Other hypertrophic osteoarthropathy, multiple sites: Secondary | ICD-10-CM | POA: Diagnosis not present

## 2019-08-22 DIAGNOSIS — I2510 Atherosclerotic heart disease of native coronary artery without angina pectoris: Secondary | ICD-10-CM | POA: Diagnosis not present

## 2019-08-22 DIAGNOSIS — F419 Anxiety disorder, unspecified: Secondary | ICD-10-CM | POA: Diagnosis not present

## 2019-08-22 DIAGNOSIS — E782 Mixed hyperlipidemia: Secondary | ICD-10-CM | POA: Diagnosis not present

## 2019-08-22 DIAGNOSIS — R413 Other amnesia: Secondary | ICD-10-CM | POA: Diagnosis not present

## 2019-08-22 DIAGNOSIS — I739 Peripheral vascular disease, unspecified: Secondary | ICD-10-CM | POA: Diagnosis not present

## 2019-08-22 DIAGNOSIS — I10 Essential (primary) hypertension: Secondary | ICD-10-CM | POA: Diagnosis not present

## 2019-08-22 DIAGNOSIS — M48061 Spinal stenosis, lumbar region without neurogenic claudication: Secondary | ICD-10-CM | POA: Diagnosis not present

## 2019-08-22 DIAGNOSIS — R5381 Other malaise: Secondary | ICD-10-CM | POA: Diagnosis not present

## 2019-08-22 DIAGNOSIS — M5136 Other intervertebral disc degeneration, lumbar region: Secondary | ICD-10-CM | POA: Diagnosis not present

## 2019-08-22 DIAGNOSIS — R5383 Other fatigue: Secondary | ICD-10-CM | POA: Diagnosis not present

## 2019-08-26 DIAGNOSIS — R1032 Left lower quadrant pain: Secondary | ICD-10-CM | POA: Diagnosis not present

## 2019-08-26 DIAGNOSIS — I700 Atherosclerosis of aorta: Secondary | ICD-10-CM | POA: Diagnosis not present

## 2019-11-01 DIAGNOSIS — J0100 Acute maxillary sinusitis, unspecified: Secondary | ICD-10-CM | POA: Diagnosis not present

## 2019-11-24 DIAGNOSIS — M5136 Other intervertebral disc degeneration, lumbar region: Secondary | ICD-10-CM | POA: Diagnosis not present

## 2019-11-24 DIAGNOSIS — M48061 Spinal stenosis, lumbar region without neurogenic claudication: Secondary | ICD-10-CM | POA: Diagnosis not present

## 2019-11-24 DIAGNOSIS — I252 Old myocardial infarction: Secondary | ICD-10-CM | POA: Diagnosis not present

## 2019-11-24 DIAGNOSIS — Z23 Encounter for immunization: Secondary | ICD-10-CM | POA: Diagnosis not present

## 2019-11-24 DIAGNOSIS — E538 Deficiency of other specified B group vitamins: Secondary | ICD-10-CM | POA: Diagnosis not present

## 2019-11-24 DIAGNOSIS — R5381 Other malaise: Secondary | ICD-10-CM | POA: Diagnosis not present

## 2019-11-24 DIAGNOSIS — E782 Mixed hyperlipidemia: Secondary | ICD-10-CM | POA: Diagnosis not present

## 2019-11-24 DIAGNOSIS — F419 Anxiety disorder, unspecified: Secondary | ICD-10-CM | POA: Diagnosis not present

## 2019-11-24 DIAGNOSIS — R5383 Other fatigue: Secondary | ICD-10-CM | POA: Diagnosis not present

## 2019-11-24 DIAGNOSIS — E875 Hyperkalemia: Secondary | ICD-10-CM | POA: Diagnosis not present

## 2019-11-24 DIAGNOSIS — I739 Peripheral vascular disease, unspecified: Secondary | ICD-10-CM | POA: Diagnosis not present

## 2019-11-24 DIAGNOSIS — I83893 Varicose veins of bilateral lower extremities with other complications: Secondary | ICD-10-CM | POA: Diagnosis not present

## 2019-11-24 DIAGNOSIS — M8949 Other hypertrophic osteoarthropathy, multiple sites: Secondary | ICD-10-CM | POA: Diagnosis not present

## 2019-11-24 DIAGNOSIS — I2510 Atherosclerotic heart disease of native coronary artery without angina pectoris: Secondary | ICD-10-CM | POA: Diagnosis not present

## 2019-11-24 DIAGNOSIS — M419 Scoliosis, unspecified: Secondary | ICD-10-CM | POA: Diagnosis not present

## 2019-11-24 DIAGNOSIS — I10 Essential (primary) hypertension: Secondary | ICD-10-CM | POA: Diagnosis not present

## 2019-12-13 DIAGNOSIS — L570 Actinic keratosis: Secondary | ICD-10-CM | POA: Diagnosis not present

## 2020-01-13 DIAGNOSIS — I252 Old myocardial infarction: Secondary | ICD-10-CM | POA: Diagnosis not present

## 2020-01-13 DIAGNOSIS — I10 Essential (primary) hypertension: Secondary | ICD-10-CM | POA: Diagnosis not present

## 2020-01-13 DIAGNOSIS — I2510 Atherosclerotic heart disease of native coronary artery without angina pectoris: Secondary | ICD-10-CM | POA: Diagnosis not present

## 2020-01-13 DIAGNOSIS — E782 Mixed hyperlipidemia: Secondary | ICD-10-CM | POA: Diagnosis not present

## 2020-02-23 DIAGNOSIS — I10 Essential (primary) hypertension: Secondary | ICD-10-CM | POA: Diagnosis not present

## 2020-02-23 DIAGNOSIS — M48061 Spinal stenosis, lumbar region without neurogenic claudication: Secondary | ICD-10-CM | POA: Diagnosis not present

## 2020-02-23 DIAGNOSIS — F419 Anxiety disorder, unspecified: Secondary | ICD-10-CM | POA: Diagnosis not present

## 2020-02-23 DIAGNOSIS — R5381 Other malaise: Secondary | ICD-10-CM | POA: Diagnosis not present

## 2020-02-23 DIAGNOSIS — R413 Other amnesia: Secondary | ICD-10-CM | POA: Diagnosis not present

## 2020-02-23 DIAGNOSIS — R5383 Other fatigue: Secondary | ICD-10-CM | POA: Diagnosis not present

## 2020-02-23 DIAGNOSIS — I2510 Atherosclerotic heart disease of native coronary artery without angina pectoris: Secondary | ICD-10-CM | POA: Diagnosis not present

## 2020-02-23 DIAGNOSIS — I252 Old myocardial infarction: Secondary | ICD-10-CM | POA: Diagnosis not present

## 2020-02-23 DIAGNOSIS — E782 Mixed hyperlipidemia: Secondary | ICD-10-CM | POA: Diagnosis not present

## 2020-02-23 DIAGNOSIS — I83893 Varicose veins of bilateral lower extremities with other complications: Secondary | ICD-10-CM | POA: Diagnosis not present

## 2020-02-23 DIAGNOSIS — M8949 Other hypertrophic osteoarthropathy, multiple sites: Secondary | ICD-10-CM | POA: Diagnosis not present

## 2020-02-23 DIAGNOSIS — M5136 Other intervertebral disc degeneration, lumbar region: Secondary | ICD-10-CM | POA: Diagnosis not present

## 2020-02-23 DIAGNOSIS — I739 Peripheral vascular disease, unspecified: Secondary | ICD-10-CM | POA: Diagnosis not present

## 2020-02-23 DIAGNOSIS — J40 Bronchitis, not specified as acute or chronic: Secondary | ICD-10-CM | POA: Diagnosis not present

## 2020-03-30 IMAGING — CT CT CERVICAL SPINE W/O CM
3 of 5 series · 13 of 33 positions shown, 16 images · non-contrast
Comparison: CT head dated July 22, 2018.

CLINICAL DATA: Fell off a ladder.

EXAM:
CT HEAD WITHOUT CONTRAST
CT CERVICAL SPINE WITHOUT CONTRAST
TECHNIQUE: Multidetector CT imaging of the head and cervical spine was
performed following the standard protocol without intravenous
contrast. Multiplanar CT image reconstructions of the cervical spine
were also generated.

[Series 5: c_spine 2.0 sag bone · sagittal · 0.28mm/px · 5 of 61 slices shown, 6 images]
[im 21/61  bone]
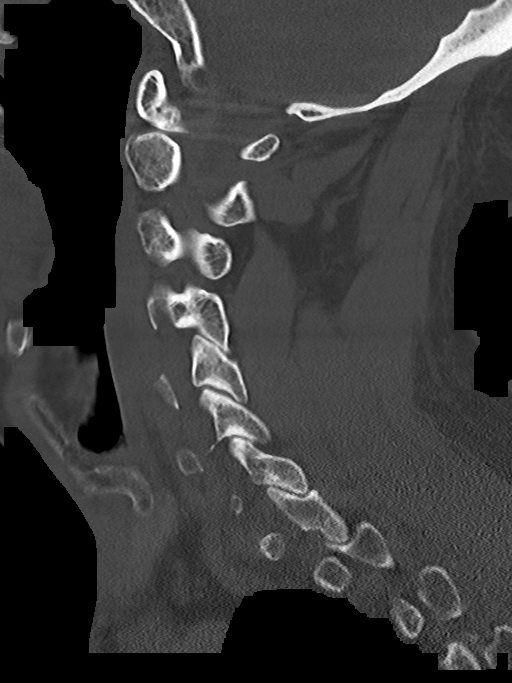
[im 26/61  bone]
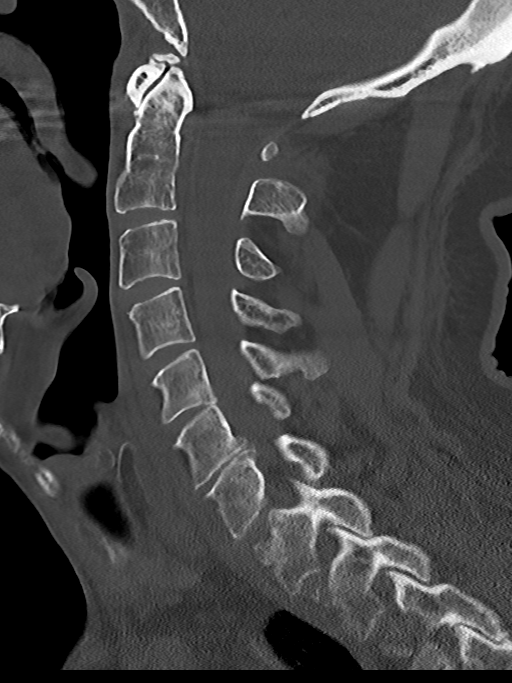
[im 31/61  soft-tissue]
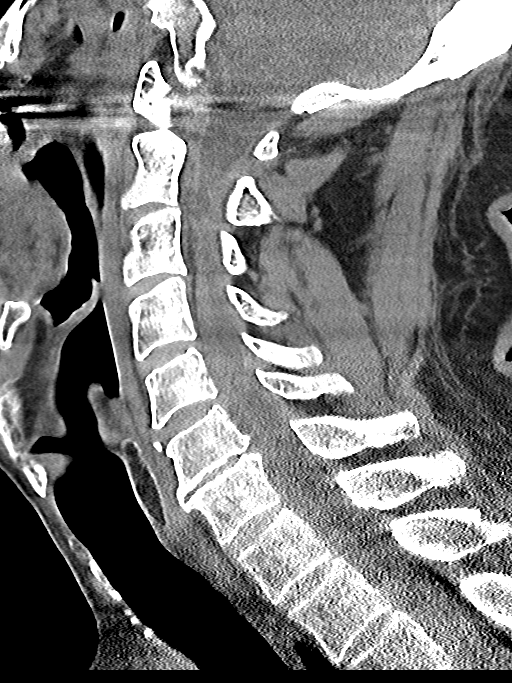
[im 31/61  bone]
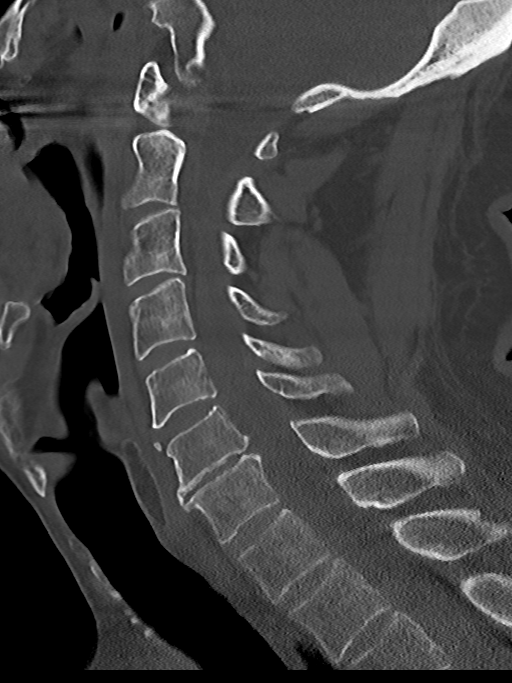
[im 36/61  bone]
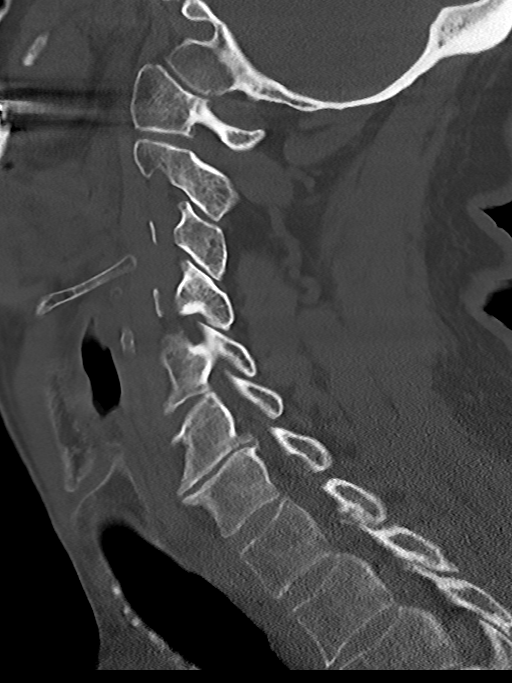
[im 41/61  bone]
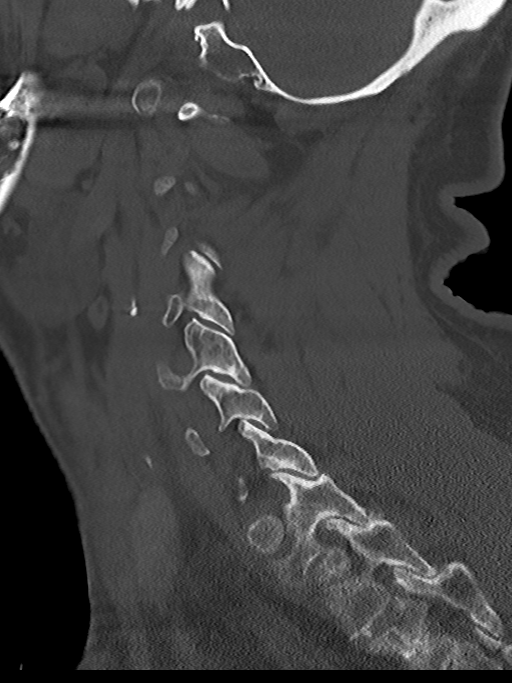

[Series 6: c_spine 2.0 cor bone · coronal · 0.28mm/px · 3 of 61 slices shown]
[im 13/61  bone]
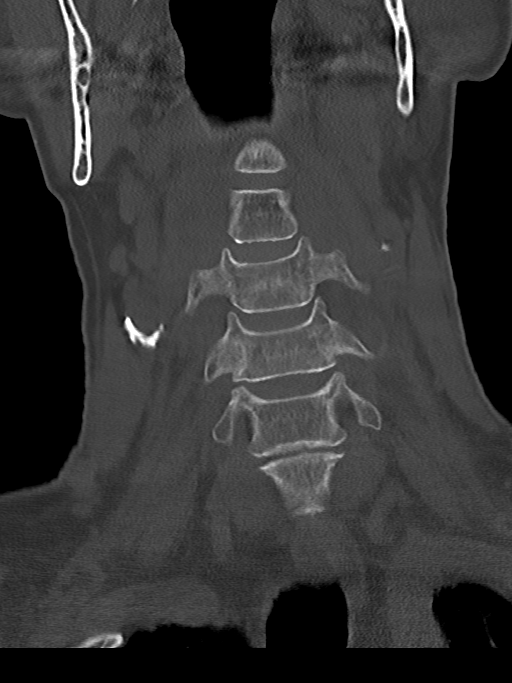
[im 25/61  bone]
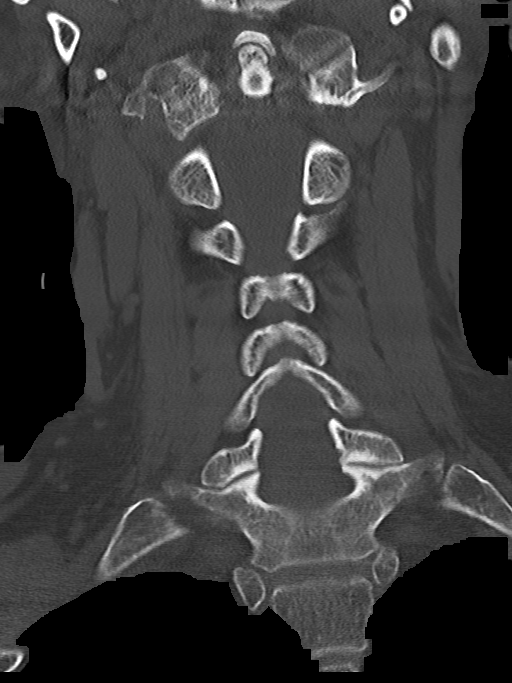
[im 37/61  bone]
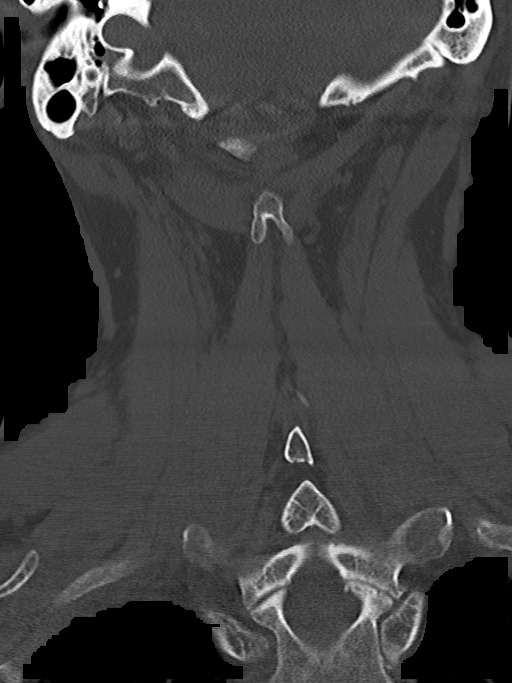

[Series 9: c_spine 1.0 3 bone thins · axial · 0.40mm/px · z∈[-307,-179]mm · 5 of 275 slices shown, 7 images]
[im 46/275  soft-tissue]
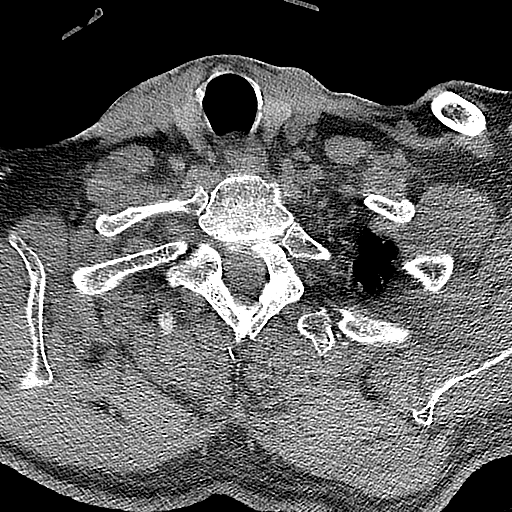
[im 46/275  bone]
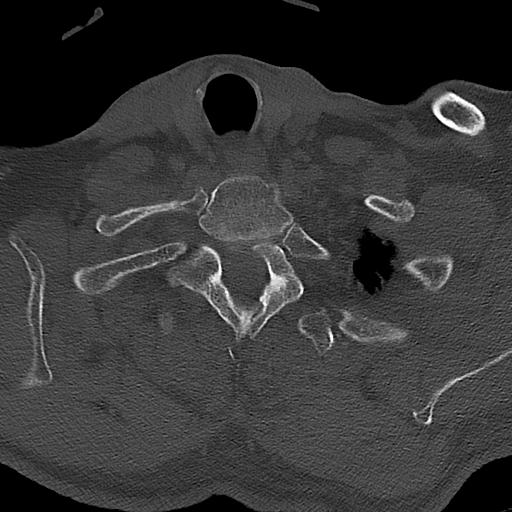
[im 92/275  bone]
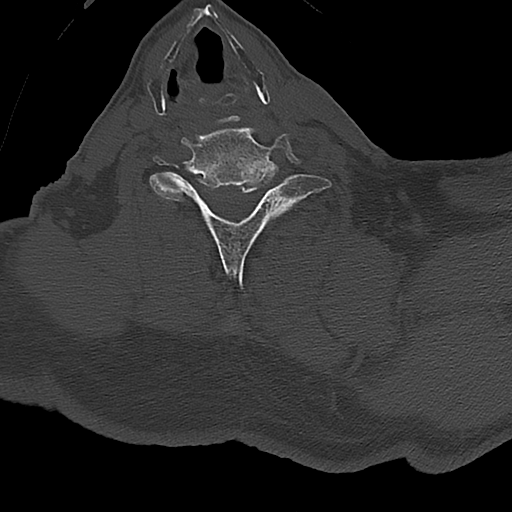
[im 138/275  bone]
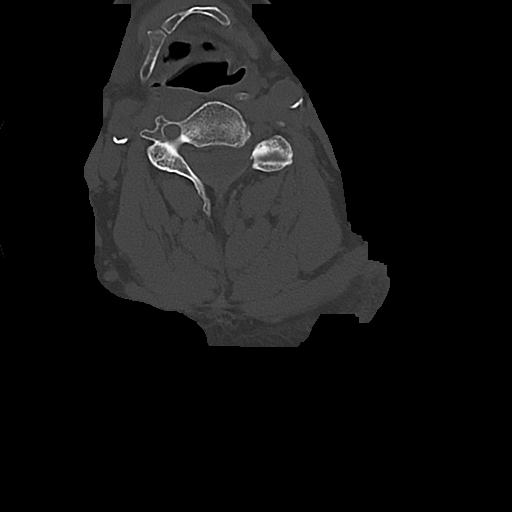
[im 183/275  bone]
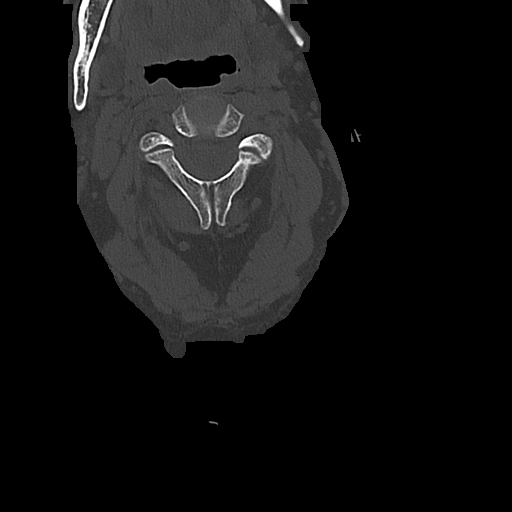
[im 229/275  soft-tissue]
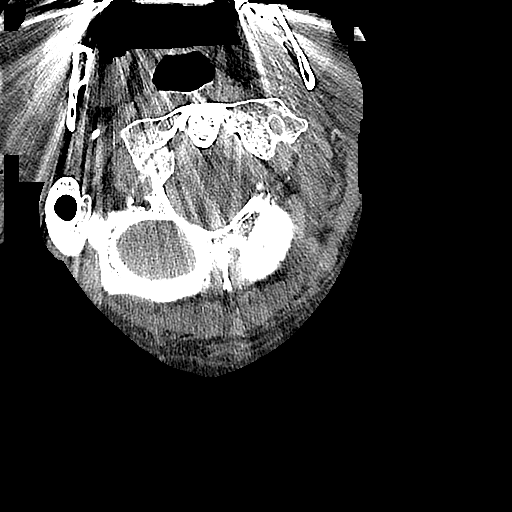
[im 229/275  bone]
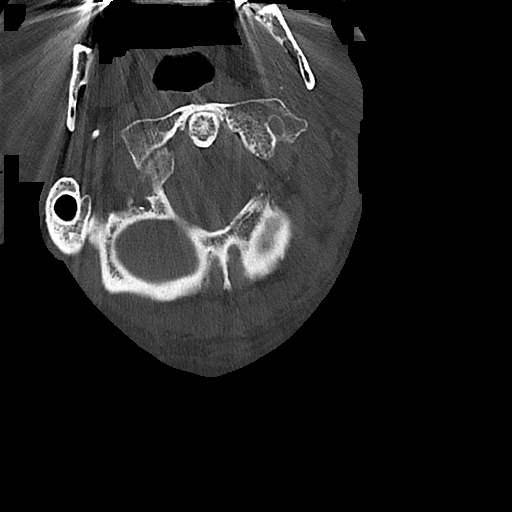

[13 of 33 positions shown; findings below may reference images not displayed]

FINDINGS: CT HEAD FINDINGS

Brain: No evidence of acute infarction, hemorrhage, hydrocephalus,
extra-axial collection or mass lesion/mass effect.

Vascular: No hyperdense vessel or unexpected calcification.

Skull: Normal. Negative for fracture or focal lesion.

Sinuses/Orbits: No acute finding. Mild ethmoid air cell mucosal
thickening.

Other: None.

CT CERVICAL SPINE FINDINGS

Alignment: No traumatic malalignment. Trace retrolisthesis at C5-C6.

Skull base and vertebrae: No acute fracture in the cervical spine.
Acute minimally displaced fractures of the T2 and T3 spinous
processes. No primary bone lesion or focal pathologic process.
Congenital incomplete fusion of the C1 posterior arch.

Soft tissues and spinal canal: No prevertebral fluid or swelling.
Suspected long segment extra-axial hematoma along the anterior
aspect of the cervical cord extending from C2-C3 to at least C6-C7
with narrowing of the thecal sac (series 5, image 28).

Disc levels: Moderate to severe disc height loss and uncovertebral
hypertrophy at C6-C7.

Upper chest: Probable pulmonary contusion in the left lung apex.
Paraseptal emphysema.

Other: None.
IMPRESSION: 1. Suspected long segment extra-axial hematoma along the anterior
aspect of the cervical cord extending from C2-C3 to the C6-C7 with
narrowing of the thecal sac. Hematoma may be subdural in location
given thin epidural fat visualized anterior to the hematoma. MRI of
the cervical spine recommended for further evaluation.
2. No acute cervical spine fracture. Acute fractures of the T2 and
T3 spinous processes.
3. Probable pulmonary contusion in the left lung apex.
4.  No acute intracranial abnormality.

Critical Value/emergent results were called by telephone at the time
of interpretation on 01/13/2019 at [DATE] to Abacs Dallos ,
who verbally acknowledged these results.

## 2020-03-30 IMAGING — CT CT CHEST W/ CM
2 of 6 series · 13 of 36 positions shown, 16 images · IV contrast (Omni 300)
Comparison: None.

CLINICAL DATA: Fall off ladder.  Abdominal trauma.

EXAM:
CT CHEST, ABDOMEN, AND PELVIS WITH CONTRAST
TECHNIQUE: Multidetector CT imaging of the chest, abdomen and pelvis was
performed following the standard protocol during bolus
administration of intravenous contrast.
CONTRAST:  100mL OMNIPAQUE IOHEXOL 300 MG/ML  SOLN

[Series 6: cap with 3.0 mm st cor · coronal · 0.79mm/px · 3 of 107 slices shown]
[im 22/107  lung]
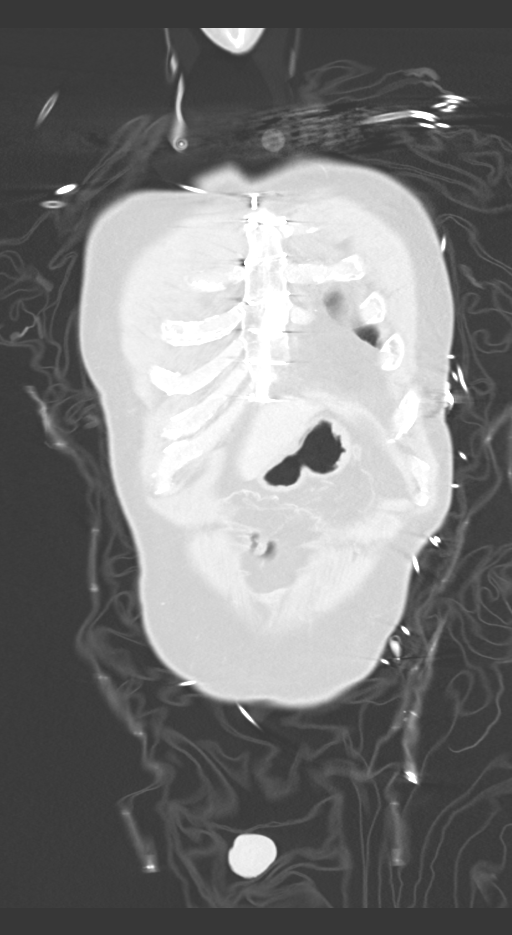
[im 43/107  lung]
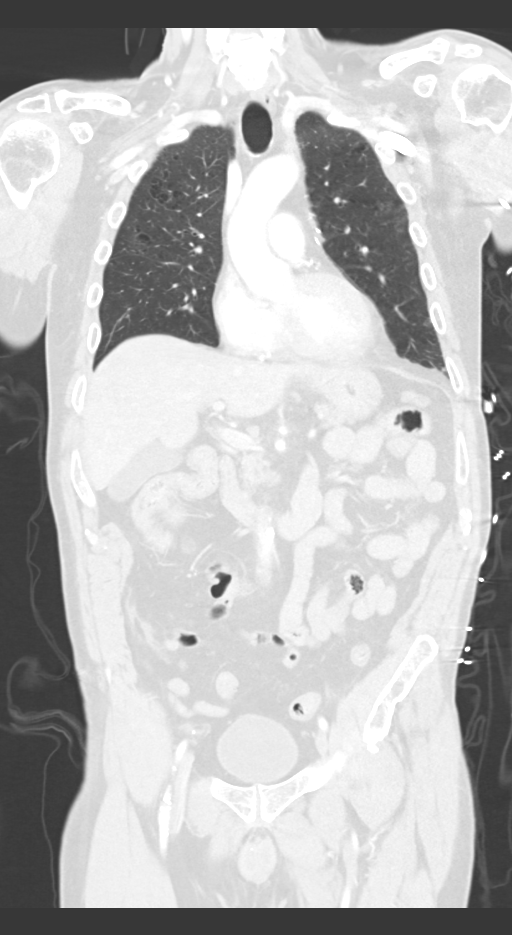
[im 64/107  lung]
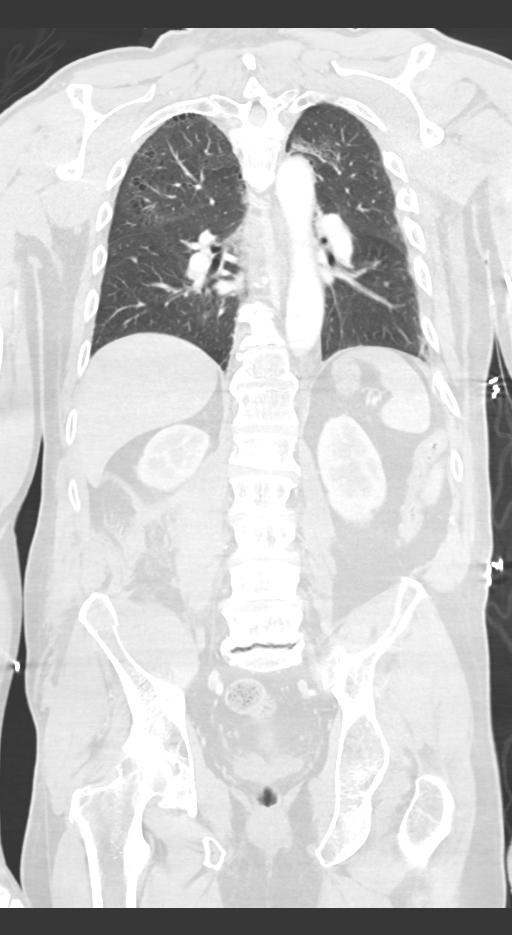

[Series 8: cap thins · axial · 0.98mm/px · z∈[-907,-294]mm · 10 of 691 slices shown, 13 images]
[im 39/691  mediastinal]
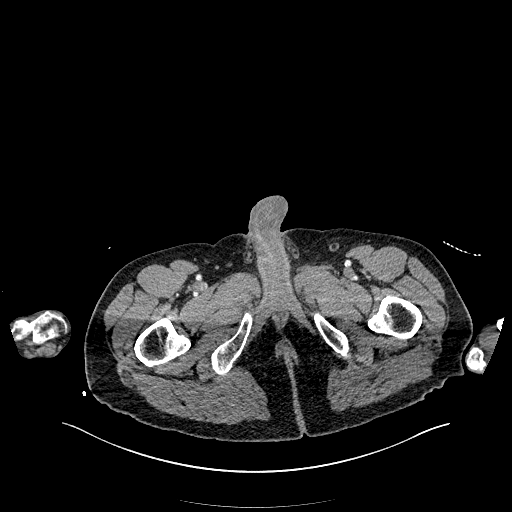
[im 39/691  lung]
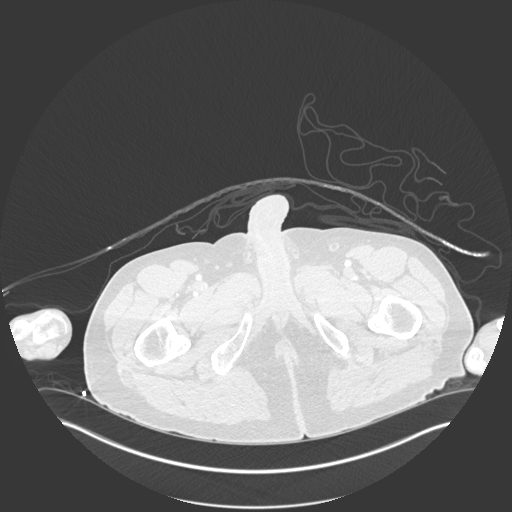
[im 116/691  lung]
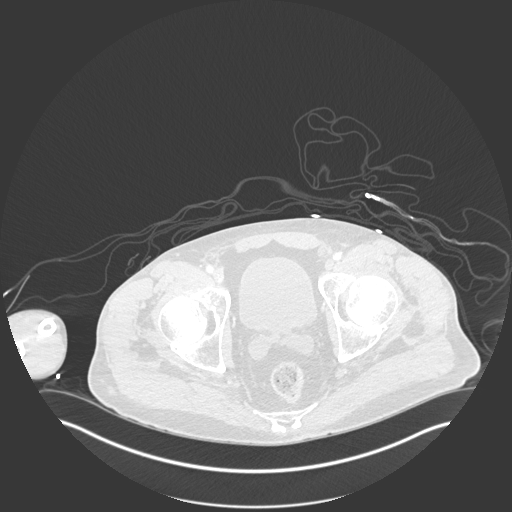
[im 192/691  lung]
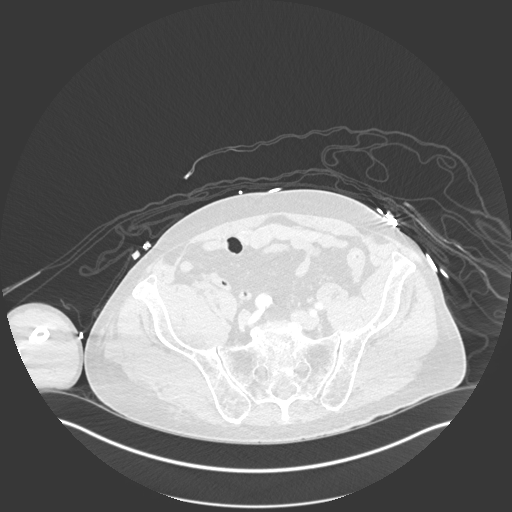
[im 231/691  lung]
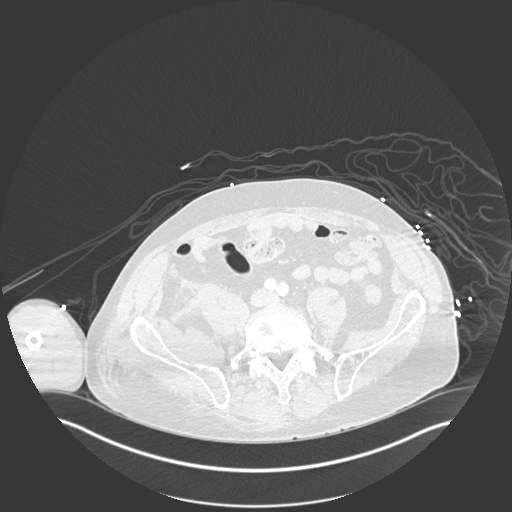
[im 307/691  mediastinal]
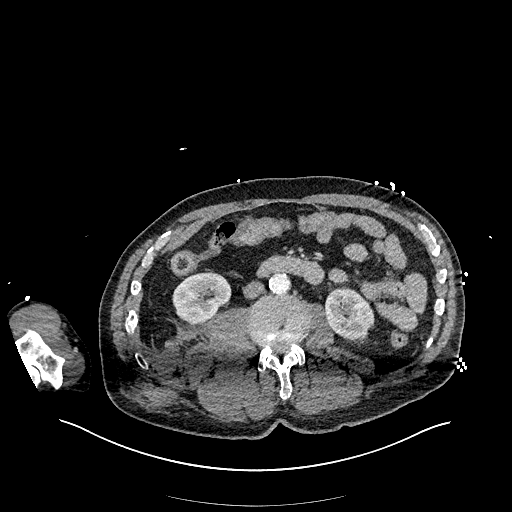
[im 307/691  lung]
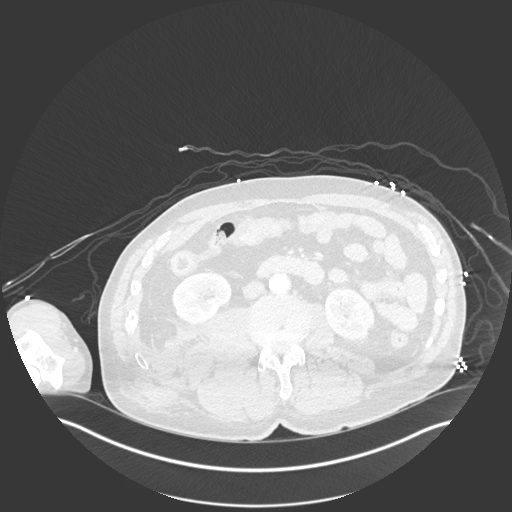
[im 384/691  lung]
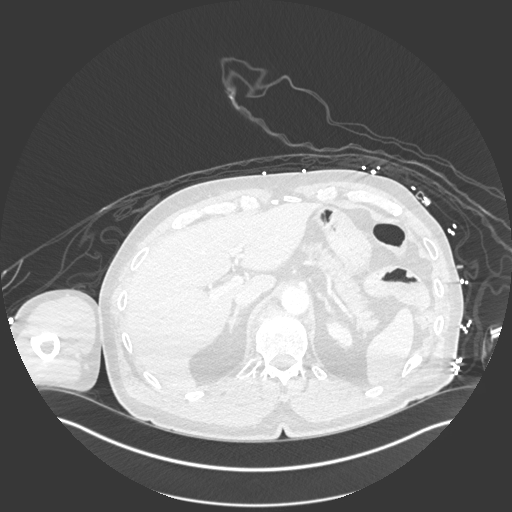
[im 461/691  lung]
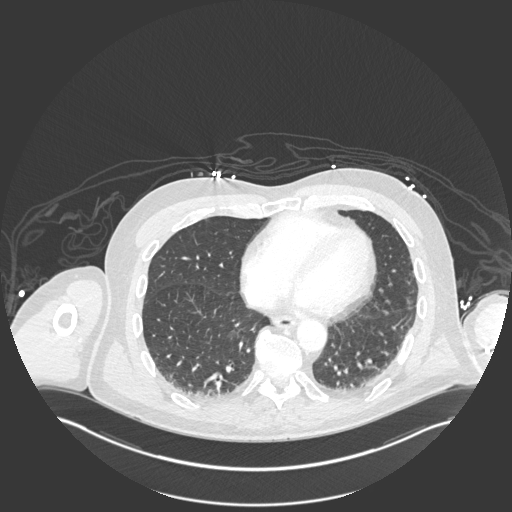
[im 499/691  lung]
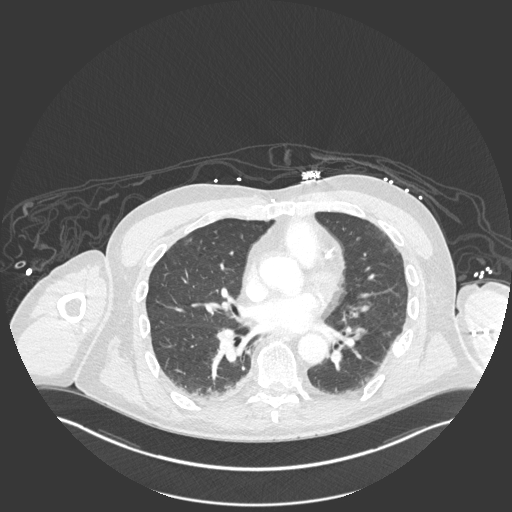
[im 576/691  mediastinal]
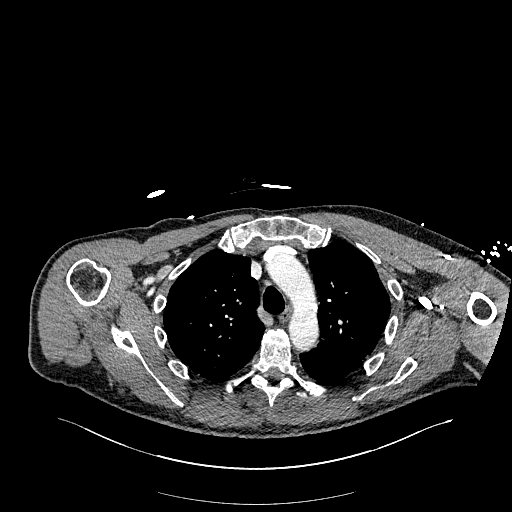
[im 576/691  lung]
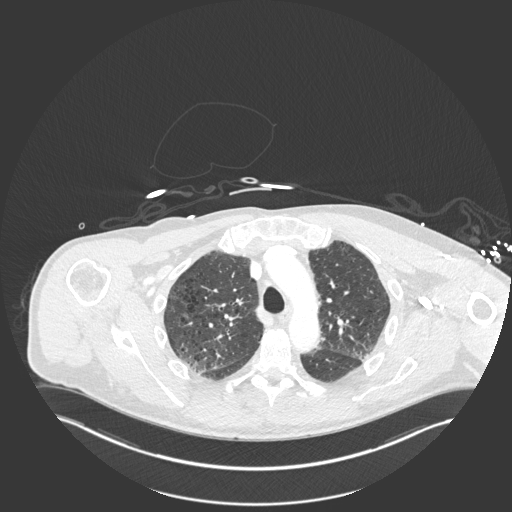
[im 652/691  lung]
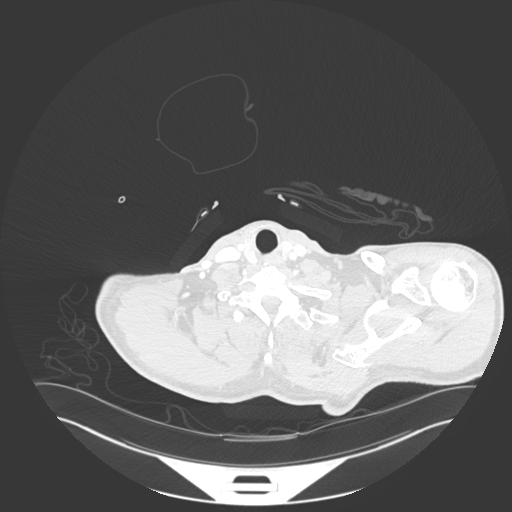

[13 of 36 positions shown; findings below may reference images not displayed]

FINDINGS: CT CHEST FINDINGS

Cardiovascular: Prior CABG. Heart is normal size. Aorta is normal
caliber. No evidence of aortic dissection or injury.

Mediastinum/Nodes: No mediastinal, hilar, or axillary adenopathy.
Trachea and esophagus are unremarkable. Thyroid unremarkable. No
mediastinal hematoma.

Lungs/Pleura: Moderate centrilobular and paraseptal emphysema.
Dependent atelectasis. No confluent opacities. No pneumothorax.

Musculoskeletal: Posterior right 11th and 12th rib fractures.
Multiple old healed left rib fractures. There appear to be acute rib
fractures on the left at the lateral left 5th and 6th ribs.

CT ABDOMEN PELVIS FINDINGS

Hepatobiliary: No focal hepatic abnormality. Gallbladder
unremarkable.

Pancreas: No focal abnormality or ductal dilatation.

Spleen: No focal abnormality.  Normal size.

Adrenals/Urinary Tract: No adrenal abnormality. No focal renal
abnormality. No stones or hydronephrosis. Urinary bladder is
unremarkable.

Stomach/Bowel: Stomach, large and small bowel grossly unremarkable.
Normal appendix.

Vascular/Lymphatic: Aortic atherosclerosis. No enlarged abdominal or
pelvic lymph nodes.

Reproductive: No visible focal abnormality.

Other: There is right posterior chest wall/abdominal wall and
retroperitoneal hematoma. Hematoma noted around the right psoas
muscle and iliopsoas muscle, posterior to the right kidney.

Musculoskeletal: Fractures through the right transverse processes of
L1 through L4. Acute fracture through the inferior aspect of the L1
vertebral body. Mild compression through the inferior endplate of L4
is age indeterminate.
IMPRESSION: Fractures through the posterior right 11th and 12th ribs. Fractures
through the right L1 through L4 transverse processes and the
inferior L1 vertebral body. A mix of old and new fractures in the
left ribs.

No pneumothorax.

Emphysema.

Right retroperitoneal and flank hematoma.

No evidence of solid organ injury.

Aortic atherosclerosis.

These results were called by telephone at the time of interpretation
on 01/13/2019 at [DATE] to provider LORRAINE JIM , who verbally
acknowledged these results.

## 2020-05-23 DIAGNOSIS — M48061 Spinal stenosis, lumbar region without neurogenic claudication: Secondary | ICD-10-CM | POA: Diagnosis not present

## 2020-05-23 DIAGNOSIS — I10 Essential (primary) hypertension: Secondary | ICD-10-CM | POA: Diagnosis not present

## 2020-05-23 DIAGNOSIS — F419 Anxiety disorder, unspecified: Secondary | ICD-10-CM | POA: Diagnosis not present

## 2020-05-23 DIAGNOSIS — M8949 Other hypertrophic osteoarthropathy, multiple sites: Secondary | ICD-10-CM | POA: Diagnosis not present

## 2020-05-23 DIAGNOSIS — S32018D Other fracture of first lumbar vertebra, subsequent encounter for fracture with routine healing: Secondary | ICD-10-CM | POA: Diagnosis not present

## 2020-05-23 DIAGNOSIS — R5383 Other fatigue: Secondary | ICD-10-CM | POA: Diagnosis not present

## 2020-05-23 DIAGNOSIS — I252 Old myocardial infarction: Secondary | ICD-10-CM | POA: Diagnosis not present

## 2020-05-23 DIAGNOSIS — E782 Mixed hyperlipidemia: Secondary | ICD-10-CM | POA: Diagnosis not present

## 2020-05-23 DIAGNOSIS — I83893 Varicose veins of bilateral lower extremities with other complications: Secondary | ICD-10-CM | POA: Diagnosis not present

## 2020-05-23 DIAGNOSIS — I739 Peripheral vascular disease, unspecified: Secondary | ICD-10-CM | POA: Diagnosis not present

## 2020-05-23 DIAGNOSIS — R5381 Other malaise: Secondary | ICD-10-CM | POA: Diagnosis not present

## 2020-05-23 DIAGNOSIS — I2510 Atherosclerotic heart disease of native coronary artery without angina pectoris: Secondary | ICD-10-CM | POA: Diagnosis not present

## 2020-05-23 DIAGNOSIS — Z0000 Encounter for general adult medical examination without abnormal findings: Secondary | ICD-10-CM | POA: Diagnosis not present

## 2020-06-19 DIAGNOSIS — L570 Actinic keratosis: Secondary | ICD-10-CM | POA: Diagnosis not present

## 2020-08-23 DIAGNOSIS — R5383 Other fatigue: Secondary | ICD-10-CM | POA: Diagnosis not present

## 2020-08-23 DIAGNOSIS — I739 Peripheral vascular disease, unspecified: Secondary | ICD-10-CM | POA: Diagnosis not present

## 2020-08-23 DIAGNOSIS — M5136 Other intervertebral disc degeneration, lumbar region: Secondary | ICD-10-CM | POA: Diagnosis not present

## 2020-08-23 DIAGNOSIS — M159 Polyosteoarthritis, unspecified: Secondary | ICD-10-CM | POA: Diagnosis not present

## 2020-08-23 DIAGNOSIS — I83893 Varicose veins of bilateral lower extremities with other complications: Secondary | ICD-10-CM | POA: Diagnosis not present

## 2020-08-23 DIAGNOSIS — I2510 Atherosclerotic heart disease of native coronary artery without angina pectoris: Secondary | ICD-10-CM | POA: Diagnosis not present

## 2020-08-23 DIAGNOSIS — F5101 Primary insomnia: Secondary | ICD-10-CM | POA: Diagnosis not present

## 2020-08-23 DIAGNOSIS — I10 Essential (primary) hypertension: Secondary | ICD-10-CM | POA: Diagnosis not present

## 2020-08-23 DIAGNOSIS — E538 Deficiency of other specified B group vitamins: Secondary | ICD-10-CM | POA: Diagnosis not present

## 2020-08-23 DIAGNOSIS — Z8781 Personal history of (healed) traumatic fracture: Secondary | ICD-10-CM | POA: Diagnosis not present

## 2020-08-23 DIAGNOSIS — E782 Mixed hyperlipidemia: Secondary | ICD-10-CM | POA: Diagnosis not present

## 2020-08-23 DIAGNOSIS — R5381 Other malaise: Secondary | ICD-10-CM | POA: Diagnosis not present

## 2020-08-23 DIAGNOSIS — R413 Other amnesia: Secondary | ICD-10-CM | POA: Diagnosis not present

## 2020-09-14 DIAGNOSIS — E782 Mixed hyperlipidemia: Secondary | ICD-10-CM | POA: Diagnosis not present

## 2020-09-14 DIAGNOSIS — I252 Old myocardial infarction: Secondary | ICD-10-CM | POA: Diagnosis not present

## 2020-09-14 DIAGNOSIS — I2510 Atherosclerotic heart disease of native coronary artery without angina pectoris: Secondary | ICD-10-CM | POA: Diagnosis not present

## 2020-09-14 DIAGNOSIS — I10 Essential (primary) hypertension: Secondary | ICD-10-CM | POA: Diagnosis not present

## 2020-11-27 DIAGNOSIS — R5383 Other fatigue: Secondary | ICD-10-CM | POA: Diagnosis not present

## 2020-11-27 DIAGNOSIS — I83893 Varicose veins of bilateral lower extremities with other complications: Secondary | ICD-10-CM | POA: Diagnosis not present

## 2020-11-27 DIAGNOSIS — I739 Peripheral vascular disease, unspecified: Secondary | ICD-10-CM | POA: Diagnosis not present

## 2020-11-27 DIAGNOSIS — R5381 Other malaise: Secondary | ICD-10-CM | POA: Diagnosis not present

## 2020-11-27 DIAGNOSIS — E538 Deficiency of other specified B group vitamins: Secondary | ICD-10-CM | POA: Diagnosis not present

## 2020-11-27 DIAGNOSIS — E782 Mixed hyperlipidemia: Secondary | ICD-10-CM | POA: Diagnosis not present

## 2020-11-27 DIAGNOSIS — I2510 Atherosclerotic heart disease of native coronary artery without angina pectoris: Secondary | ICD-10-CM | POA: Diagnosis not present

## 2020-11-27 DIAGNOSIS — F5101 Primary insomnia: Secondary | ICD-10-CM | POA: Diagnosis not present

## 2020-11-27 DIAGNOSIS — I252 Old myocardial infarction: Secondary | ICD-10-CM | POA: Diagnosis not present

## 2020-11-27 DIAGNOSIS — M5136 Other intervertebral disc degeneration, lumbar region: Secondary | ICD-10-CM | POA: Diagnosis not present

## 2020-11-27 DIAGNOSIS — F419 Anxiety disorder, unspecified: Secondary | ICD-10-CM | POA: Diagnosis not present

## 2020-11-27 DIAGNOSIS — M159 Polyosteoarthritis, unspecified: Secondary | ICD-10-CM | POA: Diagnosis not present

## 2020-11-27 DIAGNOSIS — I10 Essential (primary) hypertension: Secondary | ICD-10-CM | POA: Diagnosis not present

## 2021-01-04 DIAGNOSIS — L209 Atopic dermatitis, unspecified: Secondary | ICD-10-CM | POA: Diagnosis not present

## 2021-01-04 DIAGNOSIS — L570 Actinic keratosis: Secondary | ICD-10-CM | POA: Diagnosis not present

## 2023-06-14 ENCOUNTER — Ambulatory Visit (HOSPITAL_BASED_OUTPATIENT_CLINIC_OR_DEPARTMENT_OTHER)
Admission: EM | Admit: 2023-06-14 | Discharge: 2023-06-14 | Disposition: A | Discharge status: Home / Self Care | Attending: Physician Assistant | Admitting: Physician Assistant

## 2023-06-14 ENCOUNTER — Encounter (HOSPITAL_BASED_OUTPATIENT_CLINIC_OR_DEPARTMENT_OTHER): Payer: Self-pay | Admitting: Emergency Medicine

## 2023-06-14 DIAGNOSIS — M5441 Lumbago with sciatica, right side: Secondary | ICD-10-CM | POA: Diagnosis not present

## 2023-06-14 MED ORDER — PREDNISONE 20 MG PO TABS: ORAL_TABLET | ORAL | 0 refills | Status: AC

## 2023-06-14 MED ORDER — TRIAMCINOLONE ACETONIDE 40 MG/ML IJ SUSP
40.0000 mg | Freq: Once | INTRAMUSCULAR | Status: AC
  Administered 2023-06-14: 40 mg via INTRAMUSCULAR

## 2023-06-14 NOTE — ED Provider Notes (Signed)
 [facility]    CSN: [id number] Arrival date & time: 06/14/23  6962      History   Chief Complaint No chief complaint on file.   HPI Carlos Rowland is a 73 y.o. male.   HPI   Patient presents today with concerns for low back pain.  He reports that he was working in his flower bed on Thursday and was squatting over instead of bending at the knees.  Onset: sudden Duration: since Thursday Location: low back, moreso on the right side He reports previous history of lower back pain but this is more exacerbated  Radiation: mild radiation into right leg to level of knee  He denies numbness or tingling, saddle anesthesia, bowel or bladder incontinence  Pain level and character: 8/10 and sharp in nature  Interventions: Tylenol  Alleviating: nothing so far  Aggravating: bending over and sitting/standing straight up     Past Medical History:  Diagnosis Date   Coronary artery disease    Depression    GERD (gastroesophageal reflux disease)    HLD (hyperlipidemia)    HTN (hypertension)    Hypertension     Patient Active Problem List   Diagnosis Date Noted   Fall 01/13/2019   Fall 07/23/2018   Pneumothorax 07/23/2018   Hemothorax 07/23/2018   Acute metabolic encephalopathy 07/23/2018   Abnormal liver function 07/23/2018   Atypical pneumonia 07/23/2018   Left rib fracture 07/23/2018   Coronary artery disease    Depression    GERD (gastroesophageal reflux disease)    HLD (hyperlipidemia)    HTN (hypertension)    Hypertension    Cellulitis 09/02/2017    Past Surgical History:  Procedure Laterality Date   CARDIAC SURGERY     CORONARY ARTERY BYPASS GRAFT     HERNIA REPAIR         Home Medications    Prior to Admission medications   Medication Sig Start Date End Date Taking? Authorizing Provider  escitalopram (LEXAPRO) 20 MG tablet Take 10-20 mg by mouth 2 (two) times a day. 10mg  in the am and 20mg  at bedtime 05/12/17  Yes [provider]   gabapentin (NEURONTIN) 300 MG capsule Take 300-600 mg by mouth at bedtime. 08/26/17  Yes [provider]  predniSONE (DELTASONE) 20 MG tablet Take 60mg  PO daily x 2 days, then40mg  PO daily x 2 days, then 20mg  PO daily x 3 days 06/14/23  Yes Janisse Ghan E, PA-C  rosuvastatin (CRESTOR) 40 MG tablet Take 40 mg by mouth daily. 06/12/17  Yes [provider]  valsartan (DIOVAN) 40 MG tablet Take 40 mg by mouth daily. 10/27/18  Yes [provider]  acetaminophen (TYLENOL) 500 MG tablet Take 1,000 mg by mouth every 6 (six) hours as needed for mild pain.    [provider]  amLODipine (NORVASC) 2.5 MG tablet Take 2.5 mg by mouth daily. 06/29/17   [provider]  aspirin 81 MG chewable tablet Chew 81 mg by mouth daily. 12/09/14   [provider]  Dextromethorphan-guaiFENesin (MUCUS RELIEF DM) 30-600 MG TB12 Take 1 tablet by mouth 2 (two) times daily as needed for cough. 07/26/18   [provider]  donepezil (ARICEPT) 5 MG tablet Take 5 mg by mouth daily. 05/19/18   [provider]  escitalopram (LEXAPRO) 20 MG tablet Take 20 mg by mouth 2 (two) times daily. 12/14/18   [provider]  furosemide (LASIX) 20 MG tablet Take 20 mg by mouth daily. 11/02/18   [provider]  gabapentin (NEURONTIN) 300 MG capsule Take 300-600 mg by mouth at bedtime. 600mg  at bedtime 10/26/18   [provider]  loratadine (CLARITIN) 10 MG tablet Take 10 mg by mouth daily as needed for allergies.    [provider]  losartan (COZAAR) 100 MG tablet Take 100 mg by mouth daily. 08/04/17   [provider]  methocarbamol (ROBAXIN) 750 MG tablet Take 1 tablet (750 mg total) by mouth every 6 (six) hours. 01/19/19   Barnetta Chapel, PA-C  metoprolol tartrate (LOPRESSOR) 25 MG tablet Take 12.5 mg by mouth 2 (two) times daily.  08/16/17   [provider]  metoprolol tartrate (LOPRESSOR) 25 MG tablet Take 12.5 mg by mouth 2 (two) times  daily. 11/21/18   [provider]  oxyCODONE (OXY IR/ROXICODONE) 5 MG immediate release tablet Take 1-2 tablets (5-10 mg total) by mouth every 4 (four) hours as needed for moderate pain. 01/19/19   Barnetta Chapel, PA-C  pantoprazole (PROTONIX) 40 MG tablet Take 40 mg by mouth daily. 08/26/17   [provider]  pantoprazole (PROTONIX) 40 MG tablet Take 40 mg by mouth daily. 11/04/18   [provider]  polyethylene glycol (MIRALAX / GLYCOLAX) 17 g packet Take 17 g by mouth daily. 01/19/19   Barnetta Chapel, PA-C  promethazine (PHENERGAN) 25 MG tablet Take 25 mg by mouth every 6 (six) hours as needed for nausea. 07/29/17   [provider]  promethazine (PHENERGAN) 25 MG tablet Take 25 mg by mouth every 6 (six) hours as needed for nausea/vomiting. 12/19/18   [provider]  rosuvastatin (CRESTOR) 40 MG tablet Take 40 mg by mouth daily. 12/14/18   [provider]  traMADol (ULTRAM) 50 MG tablet Take 50 mg by mouth 2 (two) times daily as needed for moderate pain.  08/26/17   [provider]  traMADol (ULTRAM) 50 MG tablet Take 50 mg by mouth 2 (two) times daily as needed for moderate pain.  12/30/18   [provider]  traMADol (ULTRAM) 50 MG tablet Take 1-2 tablets (50-100 mg total) by mouth every 6 (six) hours as needed for moderate pain or severe pain. 01/19/19   Barnetta Chapel, PA-C    Family History History reviewed. No pertinent family history.  Social History Social History   Tobacco Use   Smoking status: Never   Smokeless tobacco: Never  Vaping Use   Vaping status: Never Used  Substance Use Topics   Alcohol use: Not Currently   Drug use: Never     Allergies   Levofloxacin   Review of Systems Review of Systems  Musculoskeletal:  Positive for back pain.  Neurological:  Negative for tremors, weakness and numbness.     Physical Exam Triage Vital Signs ED Triage Vitals  Encounter Vitals Group     BP 06/14/23  0858 124/77     Systolic BP Percentile --      Diastolic BP Percentile --      Pulse Rate 06/14/23 0858 (!) 50     Resp 06/14/23 0858 18     Temp 06/14/23 0858 98.2 F (36.8 C)     Temp Source 06/14/23 0858 Oral     SpO2 06/14/23 0858 98 %     Weight --      Height --      Head Circumference --      Peak Flow --      Pain Score 06/14/23 0856 10     Pain Loc --  Pain Education --      Exclude from Growth Chart --    No data found.  Updated Vital Signs BP 124/77 (BP Location: Right Arm)   Pulse (!) 50   Temp 98.2 F (36.8 C) (Oral)   Resp 18   SpO2 98%   Visual Acuity Right Eye Distance:   Left Eye Distance:   Bilateral Distance:    Right Eye Near:   Left Eye Near:    Bilateral Near:     Physical Exam Vitals reviewed.  Constitutional:      General: He is awake. He is not in acute distress.    Appearance: Normal appearance. He is well-developed and well-groomed. He is not ill-appearing or toxic-appearing.     Comments: Patient is a pleasant 73 year old male who appears stated age.  He is sitting on the exam chair and forward flexed position and appears to have difficulty with extension from the waist.  He does not appear to be in acute distress or diaphoretic.  HENT:     Head: Normocephalic and atraumatic.  Pulmonary:     Effort: Pulmonary effort is normal.  Musculoskeletal:     Thoracic back: Normal range of motion.     Lumbar back: Spasms and tenderness present. Decreased range of motion. Positive right straight leg raise test.     Comments: Palpation of cervical, thoracic, lumbar spine does not reveal any notable step-offs or abnormalities.  He denies focal tenderness on any level of spinal palpation.  ROM findings Thoracic: Patient reports tenderness and pain with lateral rotation to the right side.  He reports pain with lateral flexion to the right side.  Lateral rotation and flexion appear overall symmetrical at this time. Lumbar: Extension, flexion are  limited.  He reports tenderness with extension and is only able to flex to approximately 120 degrees Hips: Extension, flexion, abduction and adduction are limited on the right side.  There is positive straight leg raise on the right.  He is able to complete limited range of motion activities on the left side but they do not appear to be intact.  Neurological:     Mental Status: He is alert.  Psychiatric:        Behavior: Behavior is cooperative.      UC Treatments / Results  Labs (all labs ordered are listed, but only abnormal results are displayed) Labs Reviewed - No data to display  EKG   Radiology No results found.  Procedures Procedures (including critical care time)  Medications Ordered in UC Medications  triamcinolone acetonide (KENALOG-40) injection 40 mg (40 mg Intramuscular Given 06/14/23 1039)    Initial Impression / Assessment and Plan / UC Course  I have reviewed the triage vital signs and the nursing notes.  Pertinent labs & imaging results that were available during my care of the patient were reviewed by me and considered in my medical decision making (see chart for details).      Final Clinical Impressions(s) / UC Diagnoses   Final diagnoses:  Acute right-sided low back pain with right-sided sciatica   Patient presents today with concerns for acute low back pain with some radiation into the right leg to the level of the knee.  He reports that on Thursday he was working in his garden and was bent over instead of using proper body mechanics.  He reports difficulty with full extension at the lumbar spine as well as tenderness with flexion.  Positive straight leg raise on the right.  He  denies red flag symptoms.  At this time I suspect likely muscular etiology with mild sciatic nerve involvement of the right side.  Recommend conservative measures at this time.  Recommend active rest, warm compresses to the area, gentle stretches and massage as tolerated.  Reviewed  that he can take Tylenol as needed for further pain management.  He has been told not to take NSAIDs in the past.  Will provide Kenalog 40 mg injection here in the clinic today as well as a prednisone taper to further assist with inflammation and provide some pain management relief.  Reviewed the importance of stretches and warm compresses to further assist with symptom improvement.  ED and return precautions were reviewed and provided in after visit summary.  Follow-up as needed.     Discharge Instructions      Based on your symptoms and physical exam I believe the following is the cause of your concern today Back pain likely secondary to a strain of your back muscles  I recommend the following at this time to help relieve that discomfort:  ACTIVE Rest Warm compresses to the area (20 minutes on, minimum of 30 minutes off) You can use Tylenol per manufacturer's instructions. Gentle stretches and exercises that I have included in your paperwork Try to reduce excess strain to the area and rest as much as possible  Wear supportive shoes and, if you must lift anything, use proper lifting techniques that spare your back.  We have provided you with a Kenalog 40 mg injection.  This is a steroid that typically helps reduce inflammation. I have also sent you home with a prednisone taper to further assist with inflammation and pain relief.  You can continue to use Tylenol as needed for further pain management. I have sent in a script for Prednisone taper to be taken in the morning with breakfast per the instructions on the container Remember that steroids can cause sleeplessness, irritability, increased hunger and elevated glucose levels so be mindful of these side effects. They should lessen as you progress to the lower doses of the taper. If your symptoms appear to be worsening or not improving over the next 2 to 4 weeks please follow-up with your primary care provider or you can return to urgent care  for further evaluation and management. If at any point you start to develop numbness or weakness on one side of your body, numbness in your inner thighs, incontinence, fever or chills please go to the emergency room as these could be signs of medical emergency.       ED Prescriptions     Medication Sig Dispense Auth. Provider   predniSONE (DELTASONE) 20 MG tablet Take 60mg  PO daily x 2 days, then40mg  PO daily x 2 days, then 20mg  PO daily x 3 days 13 tablet Freja Faro E, PA-C      PDMP not reviewed this encounter.   Fawnda Vitullo, Oswaldo Conroy, PA-C 06/14/23 1048

## 2023-06-14 NOTE — Discharge Instructions (Signed)
 Based on your symptoms and physical exam I believe the following is the cause of your concern today Back pain likely secondary to a strain of your back muscles  I recommend the following at this time to help relieve that discomfort:  ACTIVE Rest Warm compresses to the area (20 minutes on, minimum of 30 minutes off) You can use Tylenol per manufacturer's instructions. Gentle stretches and exercises that I have included in your paperwork Try to reduce excess strain to the area and rest as much as possible  Wear supportive shoes and, if you must lift anything, use proper lifting techniques that spare your back.  We have provided you with a Kenalog 40 mg injection.  This is a steroid that typically helps reduce inflammation. I have also sent you home with a prednisone taper to further assist with inflammation and pain relief.  You can continue to use Tylenol as needed for further pain management. I have sent in a script for Prednisone taper to be taken in the morning with breakfast per the instructions on the container Remember that steroids can cause sleeplessness, irritability, increased hunger and elevated glucose levels so be mindful of these side effects. They should lessen as you progress to the lower doses of the taper. If your symptoms appear to be worsening or not improving over the next 2 to 4 weeks please follow-up with your primary care provider or you can return to urgent care for further evaluation and management. If at any point you start to develop numbness or weakness on one side of your body, numbness in your inner thighs, incontinence, fever or chills please go to the emergency room as these could be signs of medical emergency.

## 2023-06-14 NOTE — ED Triage Notes (Signed)
 Pt reports he was working in the flower bed on Thursday and he was squatted over instead of bending at his knees.
